# Patient Record
Sex: Male | Born: 1950 | Race: Black or African American | Hispanic: No | Marital: Single | Smoking: Current every day smoker
Health system: Southern US, Community
[De-identification: ages and names within clinical notes are randomized; demographics above are authoritative.]

---

## 2014-10-29 ENCOUNTER — Encounter (HOSPITAL_BASED_OUTPATIENT_CLINIC_OR_DEPARTMENT_OTHER): Payer: Self-pay

## 2014-10-29 ENCOUNTER — Emergency Department (HOSPITAL_BASED_OUTPATIENT_CLINIC_OR_DEPARTMENT_OTHER)
Admission: EM | Admit: 2014-10-29 | Discharge: 2014-10-29 | Disposition: A | Discharge status: Home / Self Care | Payer: Worker's Compensation | Attending: Emergency Medicine | Admitting: Emergency Medicine

## 2014-10-29 DIAGNOSIS — Z720 Tobacco use: Secondary | ICD-10-CM | POA: Diagnosis not present

## 2014-10-29 DIAGNOSIS — Y9289 Other specified places as the place of occurrence of the external cause: Secondary | ICD-10-CM | POA: Diagnosis not present

## 2014-10-29 DIAGNOSIS — Y990 Civilian activity done for income or pay: Secondary | ICD-10-CM | POA: Insufficient documentation

## 2014-10-29 DIAGNOSIS — Y288XXA Contact with other sharp object, undetermined intent, initial encounter: Secondary | ICD-10-CM | POA: Insufficient documentation

## 2014-10-29 DIAGNOSIS — Y9389 Activity, other specified: Secondary | ICD-10-CM | POA: Insufficient documentation

## 2014-10-29 DIAGNOSIS — S61412A Laceration without foreign body of left hand, initial encounter: Secondary | ICD-10-CM | POA: Diagnosis present

## 2014-10-29 DIAGNOSIS — Z23 Encounter for immunization: Secondary | ICD-10-CM | POA: Insufficient documentation

## 2014-10-29 MED ORDER — LIDOCAINE-EPINEPHRINE 1 %-1:100000 IJ SOLN
INTRAMUSCULAR | Status: AC
  Administered 2014-10-29: 1 mL
  Filled 2014-10-29: qty 1

## 2014-10-29 MED ORDER — TETANUS-DIPHTH-ACELL PERTUSSIS 5-2.5-18.5 LF-MCG/0.5 IM SUSP
0.5000 mL | Freq: Once | INTRAMUSCULAR | Status: AC
  Administered 2014-10-29: 0.5 mL via INTRAMUSCULAR
  Filled 2014-10-29: qty 0.5

## 2014-10-29 MED ORDER — LIDOCAINE-EPINEPHRINE 2 %-1:100000 IJ SOLN: 20.0000 mL | Freq: Once | INTRAMUSCULAR | Status: AC

## 2014-10-29 NOTE — ED Provider Notes (Signed)
CSN: [id number]     Arrival date & time 10/29/14  4098 History  This chart was scribed for Keith Octave, MD by Leone Payor, ED Scribe. This patient was seen in room MH08/MH08 and the patient's care was started 8:48 AM.    Chief Complaint  Patient presents with  . Extremity Laceration    The history is provided by the patient. No language interpreter was used.     HPI Comments: Keith Vincent is a 64 y.o. male who presents to the Emergency Department complaining of a laceration to the left hand that occurred 2.5 hours ago. Patient states he was using a new, clean box cutter at work when the injury occurred. He has associated minimal left hand pain. He denies numbness or tingling. Bleeding is controlled. Last tetanus is out of date.  History reviewed. No pertinent past medical history. History reviewed. No pertinent past surgical history. No family history on file. History  Substance Use Topics  . Smoking status: Current Every Day Smoker  . Smokeless tobacco: Not on file  . Alcohol Use: No    Review of Systems  A complete 10 system review of systems was obtained and all systems are negative except as noted in the HPI and PMH.    Allergies  Review of patient's allergies indicates no known allergies.  Home Medications   Prior to Admission medications   Not on File   BP 150/80 mmHg  Pulse 91  Temp(Src) 98.4 F (36.9 C) (Oral)  Resp 20  Ht  (1.727 m)  Wt 120 lb (54.432 kg)  BMI 18.25 kg/m2  SpO2 100% Physical Exam  Constitutional: He is oriented to person, place, and time. He appears well-developed and well-nourished. No distress.  HENT:  Head: Normocephalic and atraumatic.  Mouth/Throat: Oropharynx is clear and moist. No oropharyngeal exudate.  Eyes: Conjunctivae and EOM are normal. Pupils are equal, round, and reactive to light.  Neck: Normal range of motion. Neck supple.  No meningismus.  Cardiovascular: Normal rate, regular rhythm, normal heart sounds and  intact distal pulses.   No murmur heard. Pulmonary/Chest: Effort normal and breath sounds normal. No respiratory distress.  Abdominal: Soft. There is no tenderness. There is no rebound and no guarding.  Musculoskeletal: Normal range of motion. He exhibits no edema or tenderness.  Neurological: He is alert and oriented to person, place, and time. No cranial nerve deficit. He exhibits normal muscle tone. Coordination normal.  No ataxia on finger to nose bilaterally. No pronator drift. 5/5 strength throughout. CN 2-12 intact. Negative Romberg. Equal grip strength. Sensation intact. Gait is normal.   Skin: Skin is warm.  3 cm laceration to the hypothenar eminence into fat of left hand. Intact strength and sensation to all fingers.   Psychiatric: He has a normal mood and affect. His behavior is normal.  Nursing note and vitals reviewed.   ED Course  Procedures (including critical care time)  DIAGNOSTIC STUDIES: Oxygen Saturation is 100% on RA, normal by my interpretation.    COORDINATION OF CARE: 8:52 AM Discussed treatment plan with pt at bedside and pt agreed to plan.  9:07 AM LACERATION REPAIR Performed by: Keith Octave, MD  Consent: Verbal consent obtained. Risks and benefits: risks, benefits and alternatives were discussed Patient identity confirmed: provided demographic data Time out performed prior to procedure Prepped and Draped in normal sterile fashion Wound explored Laceration Location: left hand hypothenar eminence  Laceration Length: 3 cm No Foreign Bodies seen or palpated Anesthesia: local infiltration  Local anesthetic: lidocaine 1% with epinephrine Anesthetic total: 4 cc Irrigation method: syringe Amount of cleaning: standard Skin closure: 4-0 Nylon  Number of sutures: 5 Technique: simple interrupted  Patient tolerance: Patient tolerated the procedure well with no immediate complications.    Labs Review Labs Reviewed - No data to display  Imaging Review No  results found.   EKG Interpretation None      MDM   Final diagnoses:  Hand laceration, left, initial encounter   Laceration to left hand from box cutter. Neurovascularly intact. Tetanus unknown.  Wound repaired as above. Tetanus updated.  Follow-up with PCP in one week for suture removal. Return precautions discussed   Medical screening examination/treatment/procedure(s) were performed by non-physician practitioner and as supervising physician I was immediately available for consultation/collaboration.   EKG Interpretation None        Keith Octave, MD 10/29/14 [phone]

## 2014-10-29 NOTE — Discharge Instructions (Signed)

## 2014-10-29 NOTE — ED Notes (Signed)
3 cm laceration to left hand this morning while at work. Bleeding controlled, tetanus unknown

## 2014-10-29 NOTE — ED Notes (Signed)
MD at bedside. 

## 2019-08-16 ENCOUNTER — Inpatient Hospital Stay: Payer: Medicare Other

## 2019-08-16 ENCOUNTER — Inpatient Hospital Stay
Admission: EM | Admit: 2019-08-16 | Discharge: 2019-08-27 | DRG: 335 | Disposition: E | Payer: Medicare Other | Attending: Surgery | Admitting: Surgery

## 2019-08-16 ENCOUNTER — Emergency Department: Payer: Medicare Other

## 2019-08-16 ENCOUNTER — Other Ambulatory Visit: Payer: Self-pay

## 2019-08-16 DIAGNOSIS — Z66 Do not resuscitate: Secondary | ICD-10-CM | POA: Diagnosis not present

## 2019-08-16 DIAGNOSIS — I959 Hypotension, unspecified: Secondary | ICD-10-CM | POA: Diagnosis not present

## 2019-08-16 DIAGNOSIS — F17210 Nicotine dependence, cigarettes, uncomplicated: Secondary | ICD-10-CM | POA: Diagnosis present

## 2019-08-16 DIAGNOSIS — J9601 Acute respiratory failure with hypoxia: Secondary | ICD-10-CM | POA: Diagnosis not present

## 2019-08-16 DIAGNOSIS — A419 Sepsis, unspecified organism: Secondary | ICD-10-CM | POA: Diagnosis not present

## 2019-08-16 DIAGNOSIS — F10231 Alcohol dependence with withdrawal delirium: Secondary | ICD-10-CM | POA: Diagnosis not present

## 2019-08-16 DIAGNOSIS — I10 Essential (primary) hypertension: Secondary | ICD-10-CM | POA: Diagnosis present

## 2019-08-16 DIAGNOSIS — Z20828 Contact with and (suspected) exposure to other viral communicable diseases: Secondary | ICD-10-CM | POA: Diagnosis present

## 2019-08-16 DIAGNOSIS — Z515 Encounter for palliative care: Secondary | ICD-10-CM | POA: Diagnosis present

## 2019-08-16 DIAGNOSIS — R64 Cachexia: Secondary | ICD-10-CM | POA: Diagnosis present

## 2019-08-16 DIAGNOSIS — Z0189 Encounter for other specified special examinations: Secondary | ICD-10-CM

## 2019-08-16 DIAGNOSIS — K567 Ileus, unspecified: Secondary | ICD-10-CM | POA: Diagnosis not present

## 2019-08-16 DIAGNOSIS — Z9911 Dependence on respirator [ventilator] status: Secondary | ICD-10-CM

## 2019-08-16 DIAGNOSIS — R0902 Hypoxemia: Secondary | ICD-10-CM

## 2019-08-16 DIAGNOSIS — E873 Alkalosis: Secondary | ICD-10-CM | POA: Diagnosis present

## 2019-08-16 DIAGNOSIS — N170 Acute kidney failure with tubular necrosis: Secondary | ICD-10-CM | POA: Diagnosis not present

## 2019-08-16 DIAGNOSIS — E860 Dehydration: Secondary | ICD-10-CM | POA: Diagnosis present

## 2019-08-16 DIAGNOSIS — K219 Gastro-esophageal reflux disease without esophagitis: Secondary | ICD-10-CM | POA: Diagnosis present

## 2019-08-16 DIAGNOSIS — D649 Anemia, unspecified: Secondary | ICD-10-CM | POA: Diagnosis present

## 2019-08-16 DIAGNOSIS — Z681 Body mass index (BMI) 19 or less, adult: Secondary | ICD-10-CM

## 2019-08-16 DIAGNOSIS — L910 Hypertrophic scar: Secondary | ICD-10-CM | POA: Diagnosis present

## 2019-08-16 DIAGNOSIS — K56609 Unspecified intestinal obstruction, unspecified as to partial versus complete obstruction: Secondary | ICD-10-CM | POA: Diagnosis not present

## 2019-08-16 DIAGNOSIS — J9602 Acute respiratory failure with hypercapnia: Secondary | ICD-10-CM | POA: Diagnosis not present

## 2019-08-16 DIAGNOSIS — E870 Hyperosmolality and hypernatremia: Secondary | ICD-10-CM | POA: Diagnosis not present

## 2019-08-16 DIAGNOSIS — E861 Hypovolemia: Secondary | ICD-10-CM | POA: Diagnosis present

## 2019-08-16 DIAGNOSIS — J690 Pneumonitis due to inhalation of food and vomit: Secondary | ICD-10-CM | POA: Diagnosis not present

## 2019-08-16 DIAGNOSIS — N179 Acute kidney failure, unspecified: Secondary | ICD-10-CM

## 2019-08-16 DIAGNOSIS — R739 Hyperglycemia, unspecified: Secondary | ICD-10-CM | POA: Diagnosis present

## 2019-08-16 DIAGNOSIS — K565 Intestinal adhesions [bands], unspecified as to partial versus complete obstruction: Secondary | ICD-10-CM | POA: Diagnosis present

## 2019-08-16 DIAGNOSIS — R34 Anuria and oliguria: Secondary | ICD-10-CM | POA: Diagnosis present

## 2019-08-16 DIAGNOSIS — E876 Hypokalemia: Secondary | ICD-10-CM | POA: Diagnosis present

## 2019-08-16 DIAGNOSIS — Z01818 Encounter for other preprocedural examination: Secondary | ICD-10-CM

## 2019-08-16 DIAGNOSIS — R112 Nausea with vomiting, unspecified: Secondary | ICD-10-CM | POA: Diagnosis present

## 2019-08-16 DIAGNOSIS — Z8711 Personal history of peptic ulcer disease: Secondary | ICD-10-CM

## 2019-08-16 LAB — CBC
HCT: 48.2 % (ref 39.0–52.0)
Hemoglobin: 16.8 g/dL (ref 13.0–17.0)
MCH: 33.5 pg (ref 26.0–34.0)
MCHC: 34.9 g/dL (ref 30.0–36.0)
MCV: 96.2 fL (ref 80.0–100.0)
Platelets: 252 10*3/uL (ref 150–400)
RBC: 5.01 MIL/uL (ref 4.22–5.81)
RDW: 11.6 % (ref 11.5–15.5)
WBC: 5.8 10*3/uL (ref 4.0–10.5)
nRBC: 0 % (ref 0.0–0.2)

## 2019-08-16 LAB — COMPREHENSIVE METABOLIC PANEL
ALT: 11 U/L (ref 0–44)
AST: 19 U/L (ref 15–41)
Albumin: 4.4 g/dL (ref 3.5–5.0)
Alkaline Phosphatase: 46 U/L (ref 38–126)
Anion gap: 18 — ABNORMAL HIGH (ref 5–15)
BUN: 44 mg/dL — ABNORMAL HIGH (ref 8–23)
CO2: 30 mmol/L (ref 22–32)
Calcium: 8.8 mg/dL — ABNORMAL LOW (ref 8.9–10.3)
Chloride: 88 mmol/L — ABNORMAL LOW (ref 98–111)
Creatinine, Ser: 2.84 mg/dL — ABNORMAL HIGH (ref 0.61–1.24)
GFR calc Af Amer: 26 mL/min — ABNORMAL LOW (ref >60)
GFR calc non Af Amer: 22 mL/min — ABNORMAL LOW (ref >60)
Glucose, Bld: 145 mg/dL — ABNORMAL HIGH (ref 70–99)
Potassium: 3.4 mmol/L — ABNORMAL LOW (ref 3.5–5.1)
Sodium: 136 mmol/L (ref 135–145)
Total Bilirubin: 1.5 mg/dL — ABNORMAL HIGH (ref 0.3–1.2)
Total Protein: 7.8 g/dL (ref 6.5–8.1)

## 2019-08-16 LAB — LACTIC ACID, PLASMA: Lactic Acid, Venous: 1.6 mmol/L (ref 0.5–1.9)

## 2019-08-16 LAB — SARS CORONAVIRUS 2 BY RT PCR (HOSPITAL ORDER, PERFORMED IN ~~LOC~~ HOSPITAL LAB): SARS Coronavirus 2: NEGATIVE

## 2019-08-16 LAB — LIPASE, BLOOD: Lipase: 22 U/L (ref 11–51)

## 2019-08-16 MED ORDER — ONDANSETRON 4 MG PO TBDP
4.0000 mg | ORAL_TABLET | Freq: Four times a day (QID) | ORAL | Status: DC | PRN
  Filled 2019-08-16: qty 1

## 2019-08-16 MED ORDER — IOHEXOL 9 MG/ML PO SOLN
500.0000 mL | ORAL | Status: AC
  Administered 2019-08-16: 500 mL via ORAL

## 2019-08-16 MED ORDER — SODIUM CHLORIDE 0.9 % IV BOLUS
500.0000 mL | Freq: Once | INTRAVENOUS | Status: AC
  Administered 2019-08-16: 19:00:00 500 mL via INTRAVENOUS

## 2019-08-16 MED ORDER — MORPHINE SULFATE (PF) 4 MG/ML IV SOLN
4.0000 mg | Freq: Once | INTRAVENOUS | Status: AC
  Administered 2019-08-16: 19:00:00 4 mg via INTRAVENOUS
  Filled 2019-08-16: qty 1

## 2019-08-16 MED ORDER — DIATRIZOATE MEGLUMINE & SODIUM 66-10 % PO SOLN
90.0000 mL | Freq: Once | ORAL | Status: AC
  Administered 2019-08-17: 90 mL via NASOGASTRIC

## 2019-08-16 MED ORDER — MORPHINE SULFATE (PF) 2 MG/ML IV SOLN
1.0000 mg | INTRAVENOUS | Status: DC | PRN
  Administered 2019-08-18 (×2): 1 mg via INTRAVENOUS
  Filled 2019-08-16 (×2): qty 1

## 2019-08-16 MED ORDER — LACTATED RINGERS IV SOLN
INTRAVENOUS | Status: DC
  Administered 2019-08-16 – 2019-08-20 (×9): via INTRAVENOUS

## 2019-08-16 MED ORDER — ONDANSETRON HCL 4 MG/2ML IJ SOLN
4.0000 mg | Freq: Once | INTRAMUSCULAR | Status: AC
  Administered 2019-08-16: 4 mg via INTRAVENOUS
  Filled 2019-08-16: qty 2

## 2019-08-16 MED ORDER — ONDANSETRON HCL 4 MG/2ML IJ SOLN
4.0000 mg | Freq: Four times a day (QID) | INTRAMUSCULAR | Status: DC | PRN
  Administered 2019-08-17: 4 mg via INTRAVENOUS
  Filled 2019-08-16: qty 2

## 2019-08-16 MED ORDER — MORPHINE SULFATE (PF) 4 MG/ML IV SOLN
4.0000 mg | Freq: Once | INTRAVENOUS | Status: AC
  Administered 2019-08-16: 16:00:00 4 mg via INTRAVENOUS
  Filled 2019-08-16: qty 1

## 2019-08-16 MED ORDER — SODIUM CHLORIDE 0.9 % IV BOLUS
1000.0000 mL | Freq: Once | INTRAVENOUS | Status: AC
  Administered 2019-08-16: 1000 mL via INTRAVENOUS

## 2019-08-16 NOTE — ED Provider Notes (Signed)
[facility] Emergency Department Provider Note   ____________________________________________   First MD Initiated Contact with Patient 08/13/2019 1550     (approximate)  I have reviewed the triage vital signs and the nursing notes.   HISTORY  Chief Complaint Nausea    HPI Keith Vincent is a 68 y.o. male reports his only past medical history is previous alcohol abuse and a gastric surgery after an ulcer few years ago at [facility]  Patient reports that since Friday he started having significant abdominal pain.  There is fairly everywhere, quite severe but the pain subsided Monday.  However he has not been able to eat or drink anything for about 3 days.  He is not having pain now but continues to feel nausea.  Whenever he eats or drinks anything it will come back up after a bit of time.  Denies passing gas, denies bowel movement since at least Friday may be longer.     History reviewed. No pertinent past medical history.  Patient Active Problem List   Diagnosis Date Noted  . SBO (small bowel obstruction) (HCC) 08/22/2019    History reviewed. No pertinent surgical history.  Prior to Admission medications   Not on File    Allergies Patient has no known allergies.  History reviewed. No pertinent family history.  Social History Social History   Tobacco Use  . Smoking status: Current Every Day Smoker  Substance Use Topics  . Alcohol use: No  . Drug use: Not on file   Prior alcohol abuse, reports he is cut back quite a bit over the last 3 years and no longer drinks more than 1 beer daily  Review of Systems Constitutional: No fever/chills Eyes: No visual changes. ENT: No sore throat. Cardiovascular: Denies chest pain. Respiratory: Denies shortness of breath. Gastrointestinal: See HPI Genitourinary: Negative for dysuria. Musculoskeletal: Negative for back pain. Skin: Negative for rash. Neurological: Negative for  headaches, areas of focal weakness or numbness.  Denies any exposure to Covid  ____________________________________________   PHYSICAL EXAM:  VITAL SIGNS: ED Triage Vitals  Enc Vitals Group     BP --      Pulse --      Resp --      Temp --      Temp src --      SpO2 --      Weight 08/24/2019 1546 130 lb (59 kg)     Height 08/07/2019 1546 5\' 9"  (1.753 m)     Head Circumference --      Peak Flow --      Pain Score 08/17/2019 1545 0     Pain Loc --      Pain Edu? --      Excl. in GC? --     Constitutional: Alert and oriented. Well appearing and in no acute distress. Eyes: Conjunctivae are normal. Head: Atraumatic. Nose: No congestion/rhinnorhea. Mouth/Throat: Mucous membranes are dry. Neck: No stridor.  Cardiovascular: Normal rate, regular rhythm. Grossly normal heart sounds.  Good peripheral circulation. Respiratory: Normal respiratory effort.  No retractions. Lungs CTAB. Gastrointestinal: Soft and nontender.  Slightly distended, but no fluid wave.  Denies any pain or discomfort in any quadrant.  There is a soft area over the left upper quadrant, it seems just slightly protuberant but very minor in appearance which could represent an abdominal wall hernia, but it is soft and nontender.  No overlying skin lesions. Musculoskeletal: No lower extremity tenderness nor edema. Neurologic:  Normal speech and language. No gross focal neurologic deficits are appreciated.  Skin:  Skin is warm, dry and intact. No rash noted. Psychiatric: Mood and affect are normal. Speech and behavior are normal.  ____________________________________________   LABS (all labs ordered are listed, but only abnormal results are displayed)  Labs Reviewed  COMPREHENSIVE METABOLIC PANEL - Abnormal; Notable for the following components:      Result Value   Potassium 3.4 (*)    Chloride 88 (*)    Glucose, Bld 145 (*)    BUN 44 (*)    Creatinine, Ser 2.84 (*)    Calcium 8.8 (*)    Total Bilirubin 1.5 (*)     GFR calc non Af Amer 22 (*)    GFR calc Af Amer 26 (*)    Anion gap 18 (*)    All other components within normal limits  SARS CORONAVIRUS 2 BY RT PCR (HOSPITAL ORDER, PERFORMED IN [facility])  CBC  LIPASE, BLOOD   ____________________________________________  EKG   ____________________________________________  RADIOLOGY  No results found.  Discussed results with radiologist, concern for small bowel obstruction with possible free air  IMPRESSION: High-grade distal small bowel obstruction. Pneumatosis within dilated distal small bowel loops and portal venous gas in liver are highly suspicious for bowel ischemia.  Probable tiny amount of free intraperitoneal air, highly suspicious for bowel perforation.  Several small low-attenuation lesions in left hepatic lobe which cannot be characterized on this noncontrast study. Recommend further attention on follow-up CT or with MRI.  Aortic Atherosclerosis (ICD10-I70.0).  Critical Value/emergent results were called by telephone at the time of interpretation on 08/11/2019 at 6:50 pm to provider Neeraj Housand , who verbally acknowledged these results. ____________________________________________   PROCEDURES  Procedure(s) performed: None  Procedures  Critical Care performed: Yes, see critical care note(s)  CRITICAL CARE Performed by: Sharyn CreamerMark Cyndra Feinberg   Total critical care time: 35 minutes  Critical care time was exclusive of separately billable procedures and treating other patients.  Critical care was necessary to treat or prevent imminent or life-threatening deterioration.  Critical care was time spent personally by me on the following activities: development of treatment plan with patient and/or surrogate as well as nursing, discussions with consultants, evaluation of patient's response to treatment, examination of patient, obtaining history from patient or surrogate, ordering and performing treatments and  interventions, ordering and review of laboratory studies, ordering and review of radiographic studies, pulse oximetry and re-evaluation of patient's condition.  Patient with concern for perforated viscus  ____________________________________________   INITIAL IMPRESSION / ASSESSMENT AND PLAN / ED COURSE  Pertinent labs & imaging results that were available during my care of the patient were reviewed by me and considered in my medical decision making (see chart for details).   Differential diagnosis includes but is not limited to, abdominal perforation, aortic dissection, cholecystitis, appendicitis, diverticulitis, colitis, esophagitis/gastritis, kidney stone, pyelonephritis, urinary tract infection, aortic aneurysm. All are considered in decision and treatment plan. Based upon the patient's presentation and risk factors, proceed with CT scan with contrast to further evaluate for etiology.  Provide fluids, antiemetics.  Denies any acute cardiac pulmonary neurologic or vascular complaints.   Elnora MorrisonRobert A Boteler was evaluated in Emergency Department on 08/17/2019 for the symptoms described in the history of present illness. He was evaluated in the context of the global COVID-19 pandemic, which necessitated consideration that the patient might be at risk for infection with the SARS-CoV-2 virus that causes COVID-19. Institutional protocols and algorithms  that pertain to the evaluation of patients at risk for COVID-19 are in a state of rapid change based on information released by regulatory bodies including the CDC and federal and state organizations. These policies and algorithms were followed during the patient's care in the ED.   Clinical Course as of Aug 16 2015  Wed Aug 16, 2019  1623 After starting contrast, patient reports his abdominal pain is returned he is again feeling quite nauseated.  Is now sitting up at the side of the bed, appears to be wincing in pain reporting his abdominal pain is  returned.  Morphine and Zofran ordered after discussion with patient   [MQ]  [facility] consult placed staff  with Dr. Launa Flight   [MQ]    Clinical Course User Index [MQ] Delman Kitten, MD     ____________________________________________   FINAL CLINICAL IMPRESSION(S) / ED DIAGNOSES  Final diagnoses:  SBO (small bowel obstruction) ([place])        Note:  This document was prepared using Dragon voice recognition software and may include unintentional dictation errors       Delman Kitten, MD 07/28/2019 2017

## 2019-08-16 NOTE — ED Notes (Signed)
I received a call from patient's cousin [place] asking for updates on pt, asked pt if it was ok to disclose information to this person and he states no. Informed pt's cousin that I cannot disclose any information regarding this patient

## 2019-08-16 NOTE — ED Triage Notes (Addendum)
Pt speaking with Dr. Jacqualine Code at this time. Pt reports abd pain since Friday, no appetite starting Saturday, stomach pain subsided on Monday but still does not have an appetite. Reports nausea and vomiting for 3 days and not being able to have a bowel movement. Pt repots hx gastric ulcer. A&Ox4, NAD. Reports no BM in "a long time". No blood in vomit.

## 2019-08-16 NOTE — Progress Notes (Signed)
Subjective:  CC: Keith Vincent is a 68 y.o. male  Hospital stay day 0,   SBO  HPI: No changes since last exam. Slightly nauseated.  NG not placed yet.  ROS:  General: Denies weight loss, weight gain, fatigue, fevers, chills, and night sweats. Heart: Denies chest pain, palpitations, racing heart, irregular heartbeat, leg pain or swelling, and decreased activity tolerance. Respiratory: Denies breathing difficulty, shortness of breath, wheezing, cough, and sputum. GI: Denies change in appetite, heartburn,  constipation, diarrhea, and blood in stool. GU: Denies difficulty urinating, pain with urinating, urgency, frequency, blood in urine.   Objective:   Temp:  [98.4 F (36.9 C)-98.6 F (37 C)] 98.6 F (37 C) (10/21 2128) Pulse Rate:  [93-111] 102 (10/21 2128) Resp:  [18] 18 (10/21 2128) BP: (142-167)/(92-106) 167/97 (10/21 2128) SpO2:  [93 %-100 %] 100 % (10/21 2128) Weight:  [59 kg] 59 kg (10/21 1546)     Height: 5\' 9"  (175.3 cm) Weight: 59 kg BMI (Calculated): 19.19   Intake/Output this shift:  No intake or output data in the 24 hours ending 08/24/2019 2157  Constitutional :  alert, cooperative, appears stated age and no distress  Respiratory:  clear to auscultation bilaterally  Cardiovascular:  regular rate and rhythm  Gastrointestinal: soft, non-tender; bowel sounds normal; no masses,  no organomegaly.  Slight distention unchanged from previous exam  Skin: Cool and moist.   Psychiatric: Normal affect, non-agitated, not confused       LABS:  CMP Latest Ref Rng & Units 07/29/2019  Glucose 70 - 99 mg/dL 145(H)  BUN 8 - 23 mg/dL 44(H)  Creatinine 0.61 - 1.24 mg/dL 2.84(H)  Sodium 135 - 145 mmol/L 136  Potassium 3.5 - 5.1 mmol/L 3.4(L)  Chloride 98 - 111 mmol/L 88(L)  CO2 22 - 32 mmol/L 30  Calcium 8.9 - 10.3 mg/dL 8.8(L)  Total Protein 6.5 - 8.1 g/dL 7.8  Total Bilirubin 0.3 - 1.2 mg/dL 1.5(H)  Alkaline Phos 38 - 126 U/L 46  AST 15 - 41 U/L 19  ALT 0 - 44 U/L 11    CBC Latest Ref Rng & Units 08/09/2019  WBC 4.0 - 10.5 K/uL 5.8  Hemoglobin 13.0 - 17.0 g/dL 16.8  Hematocrit 39.0 - 52.0 % 48.2  Platelets 150 - 400 K/uL 252    RADS: n/a Assessment:   SBO, exam remains stable from previous exam.  Will continue to monitor.  NG tube placement now, RN notified

## 2019-08-16 NOTE — ED Notes (Signed)
Pt with large amounts of green vomit in emesis bag at this time, Dr. Jacqualine Code informed

## 2019-08-16 NOTE — H&P (Signed)
Subjective:   CC: Small bowel obstruction  HPI:  Keith Vincent is a 68 y.o. male who was consulted by [facility] for issue above.  Symptoms were first noted several days ago. Pain is sharp, intense, entire abdomen.  Associated with nausea/emeis/consitpation, exacerbated by oral intake.  States he had a very small bowel movement this morning, but not the usual amount for for a week, which is very unusual for him.  Has recently not passed flatus.  Of note, this is different from what he reported to the triage nurse as well as the ED provider.  Denies any melena, hematochezia, hematemesis.  Currently he states the pain is noticeable but tolerable after medications were administered.  He denies any recent weight changes.  Never had a colonoscopy before.   Past Medical History: none specifically reported, but chart check positive for history of alcoholism, and GERD  Past Surgical History: Open gastric ulcer repair  Family History: Reviewed and not relevant to chief complaint  Social History:  reports that he has been smoking. He does not have any smokeless tobacco history on file. He reports that he does not drink alcohol. No history on file for drug.  Current Medications: None reported  Allergies:  Allergies as of 08/15/2019  . (No Known Allergies)    ROS:  General: Denies weight loss, weight gain, fatigue, fevers, chills, and night sweats. Eyes: Denies blurry vision, double vision, eye pain, itchy eyes, and tearing. Ears: Denies hearing loss, earache, and ringing in ears. Nose: Denies sinus pain, congestion, infections, runny nose, and nosebleeds. Mouth/throat: Denies hoarseness, sore throat, bleeding gums, and difficulty swallowing. Heart: Denies chest pain, palpitations, racing heart, irregular heartbeat, leg pain or swelling, and decreased activity tolerance. Respiratory: Denies breathing difficulty, shortness of breath, wheezing, cough, and sputum. GI: Denies  diarrhea, and blood in  stool. GU: Denies difficulty urinating, pain with urinating, urgency, frequency, blood in urine. Musculoskeletal: Denies joint stiffness, pain, swelling, muscle weakness. Skin: Denies rash, itching, mass, tumors, sores, and boils Neurologic: Denies headache, fainting, dizziness, seizures, numbness, and tingling. Psychiatric: Denies depression, anxiety, difficulty sleeping, and memory loss. Endocrine: Denies heat or cold intolerance, and increased thirst or urination. Blood/lymph: Denies easy bruising, easy bruising, and swollen glands     Objective:     BP (!) 142/98   Pulse (!) 105   Temp 98.4 F (36.9 C) (Oral)   Resp 18   Ht 5\' 9"  (1.753 m)   Wt 59 kg   SpO2 96%   BMI 19.20 kg/m   Constitutional :  alert, cooperative, appears stated age and no distress  Lymphatics/Throat:  no asymmetry, masses, or scars  Respiratory:  clear to auscultation bilaterally  Cardiovascular:  regular rate and rhythm  Gastrointestinal: soft, non-tender; bowel sounds normal; no masses,  no organomegaly.   Musculoskeletal: Steady movement  Skin: Cool and moist, midline surgical scars with keloid formation.   Psychiatric: Normal affect, non-agitated, not confused       LABS:  CMP Latest Ref Rng & Units Aug 23, 2019  Glucose 70 - 99 mg/dL 07/27/2019)  BUN 8 - 23 mg/dL 865(H)  Creatinine 84(O - 1.24 mg/dL 9.62)  Sodium 9.52(W - 413 mmol/L 136  Potassium 3.5 - 5.1 mmol/L 3.4(L)  Chloride 98 - 111 mmol/L 88(L)  CO2 22 - 32 mmol/L 30  Calcium 8.9 - 10.3 mg/dL 244)  Total Protein 6.5 - 8.1 g/dL 7.8  Total Bilirubin 0.3 - 1.2 mg/dL 0.1(U)  Alkaline Phos 38 - 126 U/L 46  AST  15 - 41 U/L 19  ALT 0 - 44 U/L 11   CBC Latest Ref Rng & Units 08/26/2019  WBC 4.0 - 10.5 K/uL 5.8  Hemoglobin 13.0 - 17.0 g/dL 16.8  Hematocrit 39.0 - 52.0 % 48.2  Platelets 150 - 400 K/uL 252    RADS: CLINICAL DATA:  Abdominal pain and nausea and vomiting for 5 days.  EXAM: CT ABDOMEN AND PELVIS WITHOUT  CONTRAST  TECHNIQUE: Multidetector CT imaging of the abdomen and pelvis was performed following the standard protocol without IV contrast.  COMPARISON:  None.  FINDINGS: Lower chest: No acute findings.  Hepatobiliary: Several small low-attenuation lesions are seen in the left hepatic lobe measuring up to 1.4 cm in size, but cannot be characterized on this noncontrast study. Portal venous gas is seen throughout the right and left lobes. Gallbladder is unremarkable.  Pancreas: No mass or inflammatory process visualized on this unenhanced exam.  Spleen:  Within normal limits in size.  Adrenals/Urinary tract: No evidence of urolithiasis or hydronephrosis. Unremarkable unopacified urinary bladder.  Stomach/Bowel: Markedly dilated fluid-filled small bowel loops are seen throughout the abdomen and pelvis, with transition point in the region of the right lower quadrant. Pneumatosis is seen within dilated distal small bowel loops in the right lower abdomen. This is suspicious for bowel ischemia. Colon is nondilated. A few tiny air bubbles are seen just deep to the right rectus abdominus muscle, suspicious for free intraperitoneal air. No evidence of abscess.  Vascular/Lymphatic: No pathologically enlarged lymph nodes identified. No evidence of abdominal aortic aneurysm. Aortic atherosclerosis.  Reproductive:  No mass or other significant abnormality.  Other:  None.  Musculoskeletal:  No suspicious bone lesions identified.  IMPRESSION: High-grade distal small bowel obstruction. Pneumatosis within dilated distal small bowel loops and portal venous gas in liver are highly suspicious for bowel ischemia.  Probable tiny amount of free intraperitoneal air, highly suspicious for bowel perforation.  Several small low-attenuation lesions in left hepatic lobe which cannot be characterized on this noncontrast study. Recommend further attention on follow-up CT or with  MRI.  Aortic Atherosclerosis (ICD10-I70.0).  Critical Value/emergent results were called by telephone at the time of interpretation on 08/20/2019 at 6:50 pm to provider MARK QUALE , who verbally acknowledged these results.   Electronically Signed   By: Marlaine Hind M.D.   On: 08/04/2019 18:50  Assessment:   Small bowel obstruction based on verbal report from the radiologist along with potential areas of pneumatosis, portal venous gas.  Plan:    Despite the concerning CT images that were personally reviewed by myself as well, patient remains comfortable, with a very benign abdominal exam.  He does have persistent tachycardia in the low 100s, but that could potentially be from acute dehydration, which is consistent with his elevated creatinine level and his reported history of decreased appetite and multiple episodes of emesis.  We will continue to closely monitor him with serial abdominal exams, NG tube decompression, IV fluid hydration.  With any worsening abdominal pain, he will likely require exploratory laparotomy to confirm no ischemic bowel.  At this point since his clinical exam is so benign, I do not believe immediate surgical exploration is warranted.

## 2019-08-16 NOTE — ED Notes (Signed)
Pt otf for imaging 

## 2019-08-16 NOTE — ED Notes (Signed)
Light green tube hemolyzed per lab. Sent down new light green tube

## 2019-08-16 NOTE — Progress Notes (Signed)
NG tube placed to left nostril. Pt tolerated well.

## 2019-08-16 NOTE — ED Notes (Signed)
Patient transported to CT 

## 2019-08-16 NOTE — ED Notes (Signed)
Pt c/o abd pain and nausea, Dr. Jacqualine Code informed. Per Dr. Jacqualine Code d/c oral contrast, Mendel Ryder in CT informed.

## 2019-08-16 NOTE — ED Notes (Signed)
Report given to floor RN

## 2019-08-16 NOTE — ED Notes (Signed)
Pt reports nausea and pain have subsided

## 2019-08-16 NOTE — ED Notes (Signed)
Pt given warm blankets.

## 2019-08-17 ENCOUNTER — Inpatient Hospital Stay: Payer: Medicare Other

## 2019-08-17 LAB — CBC
HCT: 45.2 % (ref 39.0–52.0)
Hemoglobin: 15.5 g/dL (ref 13.0–17.0)
MCH: 32.6 pg (ref 26.0–34.0)
MCHC: 34.3 g/dL (ref 30.0–36.0)
MCV: 95.2 fL (ref 80.0–100.0)
Platelets: 222 10*3/uL (ref 150–400)
RBC: 4.75 MIL/uL (ref 4.22–5.81)
RDW: 11.3 % — ABNORMAL LOW (ref 11.5–15.5)
WBC: 6.8 10*3/uL (ref 4.0–10.5)
nRBC: 0 % (ref 0.0–0.2)

## 2019-08-17 LAB — BASIC METABOLIC PANEL
Anion gap: 21 — ABNORMAL HIGH (ref 5–15)
BUN: 58 mg/dL — ABNORMAL HIGH (ref 8–23)
CO2: 33 mmol/L — ABNORMAL HIGH (ref 22–32)
Calcium: 8.6 mg/dL — ABNORMAL LOW (ref 8.9–10.3)
Chloride: 84 mmol/L — ABNORMAL LOW (ref 98–111)
Creatinine, Ser: 3.15 mg/dL — ABNORMAL HIGH (ref 0.61–1.24)
GFR calc Af Amer: 23 mL/min — ABNORMAL LOW (ref >60)
GFR calc non Af Amer: 20 mL/min — ABNORMAL LOW (ref >60)
Glucose, Bld: 138 mg/dL — ABNORMAL HIGH (ref 70–99)
Potassium: 3.5 mmol/L (ref 3.5–5.1)
Sodium: 138 mmol/L (ref 135–145)

## 2019-08-17 LAB — URINALYSIS, COMPLETE (UACMP) WITH MICROSCOPIC
Bacteria, UA: NONE SEEN
Bilirubin Urine: NEGATIVE
Glucose, UA: NEGATIVE mg/dL
Hgb urine dipstick: NEGATIVE
Ketones, ur: 5 mg/dL — AB
Leukocytes,Ua: NEGATIVE
Nitrite: NEGATIVE
Protein, ur: 30 mg/dL — AB
Specific Gravity, Urine: 1.017 (ref 1.005–1.030)
pH: 6 (ref 5.0–8.0)

## 2019-08-17 LAB — LACTIC ACID, PLASMA: Lactic Acid, Venous: 1.6 mmol/L (ref 0.5–1.9)

## 2019-08-17 LAB — PHOSPHORUS: Phosphorus: 6.6 mg/dL — ABNORMAL HIGH (ref 2.5–4.6)

## 2019-08-17 LAB — MAGNESIUM: Magnesium: 2 mg/dL (ref 1.7–2.4)

## 2019-08-17 LAB — HIV ANTIBODY (ROUTINE TESTING W REFLEX): HIV Screen 4th Generation wRfx: NONREACTIVE

## 2019-08-17 MED ORDER — NICOTINE 21 MG/24HR TD PT24: 21.0000 mg | MEDICATED_PATCH | Freq: Every day | TRANSDERMAL | Status: DC | PRN

## 2019-08-17 MED ORDER — LORAZEPAM 1 MG PO TABS: 1.0000 mg | ORAL_TABLET | ORAL | Status: AC | PRN

## 2019-08-17 MED ORDER — THIAMINE HCL 100 MG/ML IJ SOLN
100.0000 mg | Freq: Every day | INTRAMUSCULAR | Status: DC
  Administered 2019-08-20 – 2019-08-23 (×3): 100 mg via INTRAVENOUS
  Filled 2019-08-17 (×3): qty 2

## 2019-08-17 MED ORDER — ADULT MULTIVITAMIN W/MINERALS CH
1.0000 | ORAL_TABLET | Freq: Every day | ORAL | Status: DC
  Administered 2019-08-22: 08:00:00 1 via ORAL
  Filled 2019-08-17 (×3): qty 1

## 2019-08-17 MED ORDER — HEPARIN SODIUM (PORCINE) 5000 UNIT/ML IJ SOLN
5000.0000 [IU] | Freq: Three times a day (TID) | INTRAMUSCULAR | Status: DC
  Administered 2019-08-17 – 2019-08-23 (×15): 5000 [IU] via SUBCUTANEOUS
  Filled 2019-08-17 (×15): qty 1

## 2019-08-17 MED ORDER — LORAZEPAM 2 MG/ML IJ SOLN: 1.0000 mg | INTRAMUSCULAR | Status: AC | PRN

## 2019-08-17 MED ORDER — VITAMIN B-1 100 MG PO TABS: 100.0000 mg | ORAL_TABLET | Freq: Every day | ORAL | Status: DC

## 2019-08-17 MED ORDER — LABETALOL HCL 5 MG/ML IV SOLN: 5.0000 mg | Freq: Four times a day (QID) | INTRAVENOUS | Status: DC | PRN

## 2019-08-17 MED ORDER — FOLIC ACID 1 MG PO TABS
1.0000 mg | ORAL_TABLET | Freq: Every day | ORAL | Status: DC
  Administered 2019-08-22: 08:00:00 1 mg via ORAL
  Filled 2019-08-17 (×3): qty 1

## 2019-08-17 NOTE — Consult Note (Addendum)
[place] [facility] NAME: Keith Vincent    MR#:  [id number]  DATE OF BIRTH:  07-11-1951  DATE OF ADMISSION:  08/24/2019  PRIMARY CARE PHYSICIAN: Patient, No Pcp Per   REQUESTING/REFERRING PHYSICIAN: Benjamine Sprague, DO  CHIEF COMPLAINT:   Chief Complaint  Patient presents with   Nausea    HISTORY OF PRESENT ILLNESS:  Keith Vincent  is a 68 y.o. male with a PMH of alcoholism and GERD who was admitted yesterday evening with severe abdominal pain, nausea, and vomiting. In the ED, CT abdomen/pelvis showed a high grade distal small bowel obstruction with pneumatosis within the dilated distal small bowel loops and portal venous gas in the liver. NG tube was placed and he was started on IVFs. On admission, he was noted to have an AKI vs CKD. Creatinine worsened from 2.84 yesterday to 3.15 this morning, so hospitalists were called for consult.  Patient states his abdominal pain has been going on for a week and was getting significantly worse. His abdominal pain is improving from yesterday to today. He is still having a few episodes of vomiting this morning. He denies any dysuria, urinary frequency, urinary urgency, or weak stream. No suprapubic abdominal pain. No NSAID use at home.  PAST MEDICAL HISTORY:  Alcohol abuse GERD  PAST SURGICAL HISTOIRY:  History reviewed. No pertinent surgical history.  SOCIAL HISTORY:   Social History   Tobacco Use   Smoking status: Current Every Day Smoker  Substance Use Topics   Alcohol use: No    FAMILY HISTORY:  Father- heart disease  DRUG ALLERGIES:  No Known Allergies  REVIEW OF SYSTEMS:  CONSTITUTIONAL: No fever, fatigue or weakness.  EYES: No blurred or double vision.  EARS, NOSE, AND THROAT: No tinnitus or ear pain.  RESPIRATORY: No cough, shortness of breath, wheezing or hemoptysis.  CARDIOVASCULAR: No chest pain, orthopnea, edema.  GASTROINTESTINAL: No nausea, vomiting, diarrhea or  abdominal pain.  GENITOURINARY: No dysuria, hematuria.  ENDOCRINE: No polyuria, nocturia,  HEMATOLOGY: No anemia, easy bruising or bleeding SKIN: No rash or lesion. MUSCULOSKELETAL: No joint pain or arthritis.   NEUROLOGIC: No tingling, numbness, weakness.  PSYCHIATRY: No anxiety or depression.   MEDICATIONS AT HOME:   Prior to Admission medications   Not on File      VITAL SIGNS:  Blood pressure (!) 145/92, pulse 100, temperature 98 F (36.7 C), temperature source Oral, resp. rate 18, height 5\' 9"  (1.753 m), weight 59 kg, SpO2 96 %.  PHYSICAL EXAMINATION:  GENERAL:  68 y.o.-year-old patient lying in the bed with no acute distress.  EYES: Pupils equal, round, reactive to light and accommodation. No scleral icterus. Extraocular muscles intact.  HEENT: Head atraumatic, normocephalic. Oropharynx and nasopharynx clear.  NECK:  Supple, no jugular venous distention. No thyroid enlargement, no tenderness.  LUNGS: Normal breath sounds bilaterally, no wheezing, rales,rhonchi or crepitation. No use of accessory muscles of respiration.  CARDIOVASCULAR: S1, S2 normal. No murmurs, rubs, or gallops.  ABDOMEN: Soft, nontender, nondistended. Bowel sounds present. No organomegaly or mass.  EXTREMITIES: No pedal edema, cyanosis, or clubbing.  NEUROLOGIC: Cranial nerves II through XII are intact. Muscle strength 5/5 in all extremities. Sensation intact. Gait not checked.  PSYCHIATRIC: The patient is alert and oriented x 3.  SKIN: No obvious rash, lesion, or ulcer.  LABORATORY PANEL:   CBC Recent Labs  Lab 08/17/19 0516  WBC 6.8  HGB 15.5  HCT 45.2  PLT 222   ------------------------------------------------------------------------------------------------------------------  Chemistries  Recent Labs  Lab 2019-08-20 1710 08/17/19 0516  NA 136 138  K 3.4* 3.5  CL 88* 84*  CO2 30 33*  GLUCOSE 145* 138*  BUN 44* 58*  CREATININE 2.84* 3.15*  CALCIUM 8.8* 8.6*  MG  --  2.0  AST 19  --     ALT 11  --   ALKPHOS 46  --   BILITOT 1.5*  --    ------------------------------------------------------------------------------------------------------------------  Cardiac Enzymes No results for input(s): TROPONINI in the last 168 hours. ------------------------------------------------------------------------------------------------------------------  RADIOLOGY:  Ct Abdomen Pelvis Wo Contrast  Result Date: 2019/08/20 CLINICAL DATA:  Abdominal pain and nausea and vomiting for 5 days. EXAM: CT ABDOMEN AND PELVIS WITHOUT CONTRAST TECHNIQUE: Multidetector CT imaging of the abdomen and pelvis was performed following the standard protocol without IV contrast. COMPARISON:  None. FINDINGS: Lower chest: No acute findings. Hepatobiliary: Several small low-attenuation lesions are seen in the left hepatic lobe measuring up to 1.4 cm in size, but cannot be characterized on this noncontrast study. Portal venous gas is seen throughout the right and left lobes. Gallbladder is unremarkable. Pancreas: No mass or inflammatory process visualized on this unenhanced exam. Spleen:  Within normal limits in size. Adrenals/Urinary tract: No evidence of urolithiasis or hydronephrosis. Unremarkable unopacified urinary bladder. Stomach/Bowel: Markedly dilated fluid-filled small bowel loops are seen throughout the abdomen and pelvis, with transition point in the region of the right lower quadrant. Pneumatosis is seen within dilated distal small bowel loops in the right lower abdomen. This is suspicious for bowel ischemia. Colon is nondilated. A few tiny air bubbles are seen just deep to the right rectus abdominus muscle, suspicious for free intraperitoneal air. No evidence of abscess. Vascular/Lymphatic: No pathologically enlarged lymph nodes identified. No evidence of abdominal aortic aneurysm. Aortic atherosclerosis. Reproductive:  No mass or other significant abnormality. Other:  None. Musculoskeletal:  No suspicious bone  lesions identified. IMPRESSION: High-grade distal small bowel obstruction. Pneumatosis within dilated distal small bowel loops and portal venous gas in liver are highly suspicious for bowel ischemia. Probable tiny amount of free intraperitoneal air, highly suspicious for bowel perforation. Several small low-attenuation lesions in left hepatic lobe which cannot be characterized on this noncontrast study. Recommend further attention on follow-up CT or with MRI. Aortic Atherosclerosis (ICD10-I70.0). Critical Value/emergent results were called by telephone at the time of interpretation on 2019-08-20 at 6:50 pm to provider MARK QUALE , who verbally acknowledged these results. Electronically Signed   By: Danae Orleans M.D.   On: 2019-08-20 18:50   Dg Abd Portable 1v-small Bowel Protocol-position Verification  Result Date: 08/17/2019 CLINICAL DATA:  NG tube placement EXAM: PORTABLE ABDOMEN - 1 VIEW COMPARISON:  None. FINDINGS: Tip of the NG tube is seen projecting over the mid body of the stomach. Mildly dilated air-filled loops of small bowel in the mid abdomen. IMPRESSION: Tip of the NG tube seen projecting over the mid body of the stomach. Electronically Signed   By: Jonna Clark M.D.   On: 08/17/2019 02:50    EKG:  No orders found for this or any previous visit.  IMPRESSION AND PLAN:   AKI- likely due to dehydration in the setting of nausea, vomiting, and poor po intake. Could also be post-obstructive in etiology. Patient is not on any nephrotoxic meds and did not receive contrast. He likely does have some underlying CKD. -Continue IVFs at 100 ml/hr -Check UA and renal US -Strict I/O -Avoid nephrotoxic agents -Recheck creatinine in the morning  High grade SBO -  NG tube in place -NPO -Management per surgery  Hypertension- BP has been elevated, likely secondary to pain -Will add labetalol IV prn   Hyperglycemia- no diagnosis of diabetes -Check A1c -May need to add sliding scale  Alcohol  abuse, possibly with some alcoholic ketoacidosis- patient drinks a couple of 40oz beers per day -CIWA -Start multivitamin, folate, and B12 -Continue IVFs  Tobacco use- smokes 1 ppd -Nicotine patch prn -Tobacco cessation counseling provided  All the records are reviewed and case discussed with Consulting provider. Management plans discussed with the patient, family and they are in agreement.  CODE STATUS: Full  TOTAL TIME TAKING CARE OF THIS PATIENT: 45 minutes.    Jinny BlossomKaty D Kaleena Corrow M.D on 08/17/2019 at 7:35 AM  Between 7am to 6pm - Pager - [phone]  After 6pm go to [email or web address] - Social research officer, governmentpassword EPAS [email or web address]  [facility]  Office  [phone]  CC: Primary care Physician: Patient, No Pcp Per   Note: This dictation was prepared with Dragon dictation along with smaller phrase technology. Any transcriptional errors that result from this process are unintentional.

## 2019-08-17 NOTE — Progress Notes (Signed)
Subjective:  CC: Keith Vincent is a 68 y.o. male  Hospital stay day 1,   SBO  HPI: No changes since last exam. Slightly nauseated.  Had another episode of emesis after NG tube placement.  ROS:  General: Denies weight loss, weight gain, fatigue, fevers, chills, and night sweats. Heart: Denies chest pain, palpitations, racing heart, irregular heartbeat, leg pain or swelling, and decreased activity tolerance. Respiratory: Denies breathing difficulty, shortness of breath, wheezing, cough, and sputum. GI: Denies change in appetite, heartburn,  constipation, diarrhea, and blood in stool. GU: Denies difficulty urinating, pain with urinating, urgency, frequency, blood in urine.   Objective:   Temp:  [98 F (36.7 C)-98.6 F (37 C)] 98 F (36.7 C) (10/22 0510) Pulse Rate:  [93-111] 100 (10/22 0510) Resp:  [18] 18 (10/22 0510) BP: (142-167)/(92-106) 145/92 (10/22 0510) SpO2:  [93 %-100 %] 96 % (10/22 0510) Weight:  [59 kg] 59 kg (10/21 1546)     Height: 5\' 9"  (175.3 cm) Weight: 59 kg BMI (Calculated): 19.19   Intake/Output this shift:   Intake/Output Summary (Last 24 hours) at 08/17/2019 1156 Last data filed at 08/17/2019 1014 Gross per 24 hour  Intake -  Output 1500 ml  Net -1500 ml    Constitutional :  alert, cooperative, appears stated age and no distress  Respiratory:  clear to auscultation bilaterally  Cardiovascular:  regular rate and rhythm  Gastrointestinal: soft, non-tender; bowel sounds normal; no masses,  no organomegaly.  Slight distention unchanged from previous exam  Skin: Cool and moist.   Psychiatric: Normal affect, non-agitated, not confused       LABS:  CMP Latest Ref Rng & Units 08/17/2019 09/06/19  Glucose 70 - 99 mg/dL 138(H) 145(H)  BUN 8 - 23 mg/dL 58(H) 44(H)  Creatinine 0.61 - 1.24 mg/dL 3.15(H) 2.84(H)  Sodium 135 - 145 mmol/L 138 136  Potassium 3.5 - 5.1 mmol/L 3.5 3.4(L)  Chloride 98 - 111 mmol/L 84(L) 88(L)  CO2 22 - 32 mmol/L 33(H) 30   Calcium 8.9 - 10.3 mg/dL 8.6(L) 8.8(L)  Total Protein 6.5 - 8.1 g/dL - 7.8  Total Bilirubin 0.3 - 1.2 mg/dL - 1.5(H)  Alkaline Phos 38 - 126 U/L - 46  AST 15 - 41 U/L - 19  ALT 0 - 44 U/L - 11   CBC Latest Ref Rng & Units 08/17/2019 09-06-19  WBC 4.0 - 10.5 K/uL 6.8 5.8  Hemoglobin 13.0 - 17.0 g/dL 15.5 16.8  Hematocrit 39.0 - 52.0 % 45.2 48.2  Platelets 150 - 400 K/uL 222 252    RADS: n/a Assessment:   SBO, exam remains stable from previous exam.  Adjustment of NG tube performed by myself and noted to have an extra 750 mL's of output afterwards.  Emesis episode likely due to a slightly malfunctioning NG tube.  We will continue to monitor at this time since patient has not worsened in physical exam.  He stated he has during the small bowel follow-through contrast.  We will follow-up on that as well.  Appreciate hospitalist consult.  Continue per their recommendations regarding acute kidney injury, hypertension, hyperglycemia, and alcohol and tobacco abuse,

## 2019-08-17 NOTE — Progress Notes (Signed)
Initial Nutrition Assessment  DOCUMENTATION CODES:   Not applicable  INTERVENTION:   RD will monitor for diet advancement vs the need for nutrition support  Pt is at high refeed risk   NUTRITION DIAGNOSIS:   Inadequate oral intake related to acute illness(SBO) as evidenced by NPO status.  GOAL:   Patient will meet greater than or equal to 90% of their needs  MONITOR:   Labs, Weight trends, Skin, I & O's, Diet advancement  REASON FOR ASSESSMENT:   Malnutrition Screening Tool    ASSESSMENT:   68 y.o. male with a PMH of alcoholism, GERD, PUD s/p ex lap and graham patch 2017 for perforated ulcer who presented with severe abdominal pain, nausea, and vomiting. CT abdomen/pelvis showed a high grade distal small bowel obstruction with pneumatosis within the dilated distal small bowel loops and portal venous gas in the liver.   Pt with poor appetite and oral intake for 1 week pta r/t abdominal pain, nausea and vomiting. RD suspects pt with poor appetite and oral intake at baseline r/t etoh abuse. Pt is currently NPO. NGT in place with 1537ml output. Pt reports continued nausea today. Per chart, pt appears fairly weight stable pta; however, there is not a very detailed weight history in chart. Pt is at high risk for malnutrition but unable to diagnose at this time as NFPE cannot be performed.    Medications reviewed and include: folic acid, heparin, MVI, thiamine, LRS @100ml /hr  Labs reviewed: K 3.5 wnl, BUN 58(H), creat 3.15(H), P 6.6(H), Mg 2.0 wnl cbgs- 145, 138 x 24 hrs  Unable to complete Nutrition-Focused physical exam at this time.   Diet Order:   Diet Order            Diet NPO time specified  Diet effective now             EDUCATION NEEDS:   No education needs have been identified at this time  Skin:  Skin Assessment: Reviewed RN Assessment  Last BM:  pta  Height:   Ht Readings from Last 1 Encounters:  08/09/2019 5\' 9"  (1.753 m)    Weight:   Wt Readings  from Last 1 Encounters:  08/14/2019 59 kg    Ideal Body Weight:  72.7 kg  BMI:  Body mass index is 19.2 kg/m.  Estimated Nutritional Needs:   Kcal:  1800-2100kcal/day  Protein:  90-105g/day  Fluid:  >1.5L/day  Koleen Distance MS, RD, LDN Pager #- [phone] Office#- [phone] After Hours Pager: [phone]

## 2019-08-18 ENCOUNTER — Inpatient Hospital Stay: Payer: Medicare Other | Admitting: Anesthesiology

## 2019-08-18 ENCOUNTER — Encounter: Payer: Self-pay | Admitting: *Deleted

## 2019-08-18 ENCOUNTER — Encounter: Admission: EM | Disposition: E | Payer: Self-pay | Source: Home / Self Care | Attending: Surgery

## 2019-08-18 HISTORY — PX: LYSIS OF ADHESION: SHX5961

## 2019-08-18 HISTORY — PX: LAPAROTOMY: SHX154

## 2019-08-18 LAB — BASIC METABOLIC PANEL
Anion gap: 24 — ABNORMAL HIGH (ref 5–15)
BUN: 78 mg/dL — ABNORMAL HIGH (ref 8–23)
CO2: 36 mmol/L — ABNORMAL HIGH (ref 22–32)
Calcium: 8.4 mg/dL — ABNORMAL LOW (ref 8.9–10.3)
Chloride: 81 mmol/L — ABNORMAL LOW (ref 98–111)
Creatinine, Ser: 4.44 mg/dL — ABNORMAL HIGH (ref 0.61–1.24)
GFR calc Af Amer: 15 mL/min — ABNORMAL LOW (ref >60)
GFR calc non Af Amer: 13 mL/min — ABNORMAL LOW (ref >60)
Glucose, Bld: 100 mg/dL — ABNORMAL HIGH (ref 70–99)
Potassium: 3.3 mmol/L — ABNORMAL LOW (ref 3.5–5.1)
Sodium: 141 mmol/L (ref 135–145)

## 2019-08-18 LAB — CBC
HCT: 45 % (ref 39.0–52.0)
Hemoglobin: 15.1 g/dL (ref 13.0–17.0)
MCH: 33 pg (ref 26.0–34.0)
MCHC: 33.6 g/dL (ref 30.0–36.0)
MCV: 98.3 fL (ref 80.0–100.0)
Platelets: 225 10*3/uL (ref 150–400)
RBC: 4.58 MIL/uL (ref 4.22–5.81)
RDW: 11.7 % (ref 11.5–15.5)
WBC: 6.1 10*3/uL (ref 4.0–10.5)
nRBC: 0 % (ref 0.0–0.2)

## 2019-08-18 LAB — HEMOGLOBIN A1C
Hgb A1c MFr Bld: 4.5 % — ABNORMAL LOW (ref 4.8–5.6)
Mean Plasma Glucose: 82.45 mg/dL

## 2019-08-18 LAB — PHOSPHORUS: Phosphorus: 6.9 mg/dL — ABNORMAL HIGH (ref 2.5–4.6)

## 2019-08-18 LAB — MAGNESIUM: Magnesium: 2.3 mg/dL (ref 1.7–2.4)

## 2019-08-18 SURGERY — LAPAROTOMY, EXPLORATORY
Anesthesia: General | Site: Abdomen

## 2019-08-18 MED ORDER — SUCCINYLCHOLINE CHLORIDE 20 MG/ML IJ SOLN
INTRAMUSCULAR | Status: AC
  Filled 2019-08-18: qty 1

## 2019-08-18 MED ORDER — FENTANYL CITRATE (PF) 100 MCG/2ML IJ SOLN
INTRAMUSCULAR | Status: AC
  Filled 2019-08-18: qty 2

## 2019-08-18 MED ORDER — HYDROMORPHONE HCL 1 MG/ML IJ SOLN
0.5000 mg | INTRAMUSCULAR | Status: DC | PRN
  Administered 2019-08-18 – 2019-08-21 (×12): 0.5 mg via INTRAVENOUS
  Filled 2019-08-18 (×2): qty 0.5
  Filled 2019-08-18: qty 1
  Filled 2019-08-18: qty 0.5
  Filled 2019-08-18 (×3): qty 1
  Filled 2019-08-18 (×4): qty 0.5
  Filled 2019-08-18: qty 1

## 2019-08-18 MED ORDER — BUPIVACAINE-EPINEPHRINE 0.5% -1:200000 IJ SOLN
INTRAMUSCULAR | Status: DC | PRN
  Administered 2019-08-18: 30 mL

## 2019-08-18 MED ORDER — FENTANYL CITRATE (PF) 100 MCG/2ML IJ SOLN
INTRAMUSCULAR | Status: AC
  Administered 2019-08-18: 17:00:00 25 ug via INTRAVENOUS
  Filled 2019-08-18: qty 2

## 2019-08-18 MED ORDER — LACTATED RINGERS IV SOLN
INTRAVENOUS | Status: DC | PRN
  Administered 2019-08-18: 16:00:00 via INTRAVENOUS

## 2019-08-18 MED ORDER — PROPOFOL 10 MG/ML IV BOLUS
INTRAVENOUS | Status: AC
  Filled 2019-08-18: qty 20

## 2019-08-18 MED ORDER — FENTANYL CITRATE (PF) 100 MCG/2ML IJ SOLN
25.0000 ug | INTRAMUSCULAR | Status: DC | PRN
  Administered 2019-08-18 (×4): 25 ug via INTRAVENOUS

## 2019-08-18 MED ORDER — PHENYLEPHRINE HCL (PRESSORS) 10 MG/ML IV SOLN
INTRAVENOUS | Status: DC | PRN
  Administered 2019-08-18 (×2): 100 ug via INTRAVENOUS
  Administered 2019-08-18: 150 ug via INTRAVENOUS

## 2019-08-18 MED ORDER — MIDAZOLAM HCL 2 MG/2ML IJ SOLN
INTRAMUSCULAR | Status: AC
  Filled 2019-08-18: qty 2

## 2019-08-18 MED ORDER — FENTANYL CITRATE (PF) 100 MCG/2ML IJ SOLN
INTRAMUSCULAR | Status: DC | PRN
  Administered 2019-08-18: 50 ug via INTRAVENOUS
  Administered 2019-08-18: 100 ug via INTRAVENOUS
  Administered 2019-08-18: 50 ug via INTRAVENOUS

## 2019-08-18 MED ORDER — BUPIVACAINE LIPOSOME 1.3 % IJ SUSP
INTRAMUSCULAR | Status: DC | PRN
  Administered 2019-08-18: 20 mL

## 2019-08-18 MED ORDER — PROPOFOL 10 MG/ML IV BOLUS
INTRAVENOUS | Status: DC | PRN
  Administered 2019-08-18: 80 mg via INTRAVENOUS

## 2019-08-18 MED ORDER — BUPIVACAINE LIPOSOME 1.3 % IJ SUSP
INTRAMUSCULAR | Status: AC
  Filled 2019-08-18: qty 20

## 2019-08-18 MED ORDER — BUPIVACAINE HCL (PF) 0.5 % IJ SOLN
INTRAMUSCULAR | Status: AC
  Filled 2019-08-18: qty 30

## 2019-08-18 MED ORDER — EPINEPHRINE PF 1 MG/ML IJ SOLN
INTRAMUSCULAR | Status: AC
  Filled 2019-08-18: qty 1

## 2019-08-18 MED ORDER — ROCURONIUM BROMIDE 100 MG/10ML IV SOLN
INTRAVENOUS | Status: DC | PRN
  Administered 2019-08-18: 30 mg via INTRAVENOUS
  Administered 2019-08-18: 20 mg via INTRAVENOUS
  Administered 2019-08-18: 10 mg via INTRAVENOUS

## 2019-08-18 MED ORDER — SUCCINYLCHOLINE CHLORIDE 20 MG/ML IJ SOLN
INTRAMUSCULAR | Status: DC | PRN
  Administered 2019-08-18: 80 mg via INTRAVENOUS

## 2019-08-18 MED ORDER — SODIUM CHLORIDE 0.9 % IV SOLN
INTRAVENOUS | Status: DC | PRN
  Administered 2019-08-18: 14:00:00 via INTRAVENOUS

## 2019-08-18 MED ORDER — SODIUM CHLORIDE (PF) 0.9 % IJ SOLN
INTRAMUSCULAR | Status: AC
  Filled 2019-08-18: qty 10

## 2019-08-18 MED ORDER — ONDANSETRON HCL 4 MG/2ML IJ SOLN: 4.0000 mg | Freq: Once | INTRAMUSCULAR | Status: DC | PRN

## 2019-08-18 MED ORDER — SODIUM CHLORIDE (PF) 0.9 % IJ SOLN
INTRAMUSCULAR | Status: AC
  Filled 2019-08-18: qty 50

## 2019-08-18 MED ORDER — ONDANSETRON HCL 4 MG/2ML IJ SOLN
INTRAMUSCULAR | Status: DC | PRN
  Administered 2019-08-18: 4 mg via INTRAVENOUS

## 2019-08-18 MED ORDER — LABETALOL HCL 5 MG/ML IV SOLN
INTRAVENOUS | Status: DC | PRN
  Administered 2019-08-18: 5 mg via INTRAVENOUS

## 2019-08-18 MED ORDER — POTASSIUM CHLORIDE 10 MEQ/100ML IV SOLN
10.0000 meq | INTRAVENOUS | Status: AC
  Administered 2019-08-18 (×2): 10 meq via INTRAVENOUS
  Filled 2019-08-18 (×2): qty 100

## 2019-08-18 MED ORDER — SODIUM CHLORIDE (PF) 0.9 % IJ SOLN
INTRAMUSCULAR | Status: DC | PRN
  Administered 2019-08-18: 50 mL

## 2019-08-18 MED ORDER — SUGAMMADEX SODIUM 200 MG/2ML IV SOLN
INTRAVENOUS | Status: DC | PRN
  Administered 2019-08-18: 120 mg via INTRAVENOUS

## 2019-08-18 MED ORDER — LACTATED RINGERS IV SOLN
INTRAVENOUS | Status: DC | PRN
  Administered 2019-08-18: 17:00:00 via INTRAVENOUS

## 2019-08-18 MED ORDER — LIDOCAINE HCL (PF) 2 % IJ SOLN
INTRAMUSCULAR | Status: AC
  Filled 2019-08-18: qty 10

## 2019-08-18 MED ORDER — LIDOCAINE HCL (CARDIAC) PF 100 MG/5ML IV SOSY
PREFILLED_SYRINGE | INTRAVENOUS | Status: DC | PRN
  Administered 2019-08-18: 100 mg via INTRAVENOUS

## 2019-08-18 MED ORDER — CEFAZOLIN SODIUM-DEXTROSE 2-3 GM-%(50ML) IV SOLR
INTRAVENOUS | Status: DC | PRN
  Administered 2019-08-18: 2 g via INTRAVENOUS

## 2019-08-18 SURGICAL SUPPLY — 33 items
CANISTER SUCT 1200ML W/VALVE (MISCELLANEOUS) ×3 IMPLANT
CHLORAPREP W/TINT 26 (MISCELLANEOUS) ×3 IMPLANT
COVER WAND RF STERILE (DRAPES) ×3 IMPLANT
DRAPE LAPAROTOMY 100X77 ABD (DRAPES) ×3 IMPLANT
DRSG OPSITE POSTOP 4X10 (GAUZE/BANDAGES/DRESSINGS) ×3 IMPLANT
DRSG OPSITE POSTOP 4X8 (GAUZE/BANDAGES/DRESSINGS) ×1 IMPLANT
DRSG TEGADERM 4X10 (GAUZE/BANDAGES/DRESSINGS) ×1 IMPLANT
DRSG TELFA 3X8 NADH (GAUZE/BANDAGES/DRESSINGS) ×3 IMPLANT
ELECT REM PT RETURN 9FT ADLT (ELECTROSURGICAL) ×3
ELECTRODE REM PT RTRN 9FT ADLT (ELECTROSURGICAL) ×1 IMPLANT
GLOVE BIOGEL PI IND STRL 7.0 (GLOVE) ×1 IMPLANT
GLOVE BIOGEL PI INDICATOR 7.0 (GLOVE) ×2
GLOVE SURG SYN 6.5 ES PF (GLOVE) ×3 IMPLANT
GLOVE SURG SYN 6.5 PF PI (GLOVE) ×1 IMPLANT
GOWN STRL REUS W/ TWL LRG LVL3 (GOWN DISPOSABLE) ×2 IMPLANT
GOWN STRL REUS W/TWL LRG LVL3 (GOWN DISPOSABLE) ×8
KIT TURNOVER KIT A (KITS) ×3 IMPLANT
LABEL OR SOLS (LABEL) ×3 IMPLANT
NS IRRIG 1000ML POUR BTL (IV SOLUTION) ×3 IMPLANT
PACK BASIN MAJOR ARMC (MISCELLANEOUS) ×3 IMPLANT
PACK COLON CLEAN CLOSURE (MISCELLANEOUS) ×1 IMPLANT
PAD DRESSING TELFA 3X8 NADH (GAUZE/BANDAGES/DRESSINGS) ×1 IMPLANT
SPONGE LAP 18X18 RF (DISPOSABLE) ×3 IMPLANT
STAPLER SKIN PROX 35W (STAPLE) ×2 IMPLANT
SUT PDS AB 1 TP1 54 (SUTURE) ×6 IMPLANT
SUT SILK 2 0 (SUTURE) ×2
SUT SILK 2-0 18XBRD TIE 12 (SUTURE) ×1 IMPLANT
SUT SILK 3 0 (SUTURE) ×2
SUT SILK 3-0 (SUTURE) ×2 IMPLANT
SUT SILK 3-0 18XBRD TIE 12 (SUTURE) ×1 IMPLANT
SUT VIC AB 3-0 SH 27 (SUTURE) ×4
SUT VIC AB 3-0 SH 27X BRD (SUTURE) ×2 IMPLANT
TRAY FOLEY MTR SLVR 16FR STAT (SET/KITS/TRAYS/PACK) ×3 IMPLANT

## 2019-08-18 NOTE — Progress Notes (Signed)
Lungs clear but has poor effort

## 2019-08-18 NOTE — Anesthesia Postprocedure Evaluation (Signed)
Anesthesia Post Note  Patient: Keith Vincent  Procedure(s) Performed: EXPLORATORY LAPAROTOMY (N/A Abdomen) LYSIS OF ADHESION (N/A Abdomen)  Patient location during evaluation: PACU Anesthesia Type: General Level of consciousness: awake and alert Pain management: pain level controlled Vital Signs Assessment: post-procedure vital signs reviewed and stable Respiratory status: spontaneous breathing, nonlabored ventilation, respiratory function stable and patient connected to nasal cannula oxygen Cardiovascular status: blood pressure returned to baseline and stable Postop Assessment: no apparent nausea or vomiting Anesthetic complications: no     Last Vitals:  Vitals:   08-27-2019 2123 Aug 27, 2019 2134  BP:    Pulse: (!) 106 (!) 113  Resp:    Temp:    SpO2: 90% 91%    Last Pain:  Vitals:   08-27-2019 2121  TempSrc: Oral  PainSc:                  [name]

## 2019-08-18 NOTE — Progress Notes (Signed)
[facility] [place] [facility]   PATIENT NAME: Keith Vincent    MR#:  [id number]  DATE OF BIRTH:  06/14/1951  SUBJECTIVE:   Patient is having continued severe abdominal pain today.  He states that his nausea and vomiting have resolved.  He has not been passing gas or had a bowel movement.  REVIEW OF SYSTEMS:  Review of Systems  Constitutional: Negative for chills and fever.  HENT: Negative for congestion and sore throat.   Eyes: Negative for blurred vision and double vision.  Respiratory: Negative for cough and shortness of breath.   Cardiovascular: Negative for chest pain and palpitations.  Gastrointestinal: Positive for abdominal pain. Negative for diarrhea, nausea and vomiting.  Genitourinary: Negative for dysuria and urgency.  Musculoskeletal: Negative for back pain and neck pain.  Neurological: Negative for dizziness and headaches.  Psychiatric/Behavioral: Negative for depression. The patient is not nervous/anxious.     DRUG ALLERGIES:  No Known Allergies VITALS:  Blood pressure 123/88, pulse (!) 103, temperature (!) 96.9 F (36.1 C), temperature source Tympanic, resp. rate 16, height 5\' 9"  (1.753 m), weight 59 kg, SpO2 94 %. PHYSICAL EXAMINATION:  Physical Exam  GENERAL:  Laying in the bed with no acute distress. Appears to be in pain. HEENT: Head atraumatic, normocephalic. Pupils equal, round, reactive to light and accommodation. No scleral icterus. Extraocular muscles intact. Oropharynx and nasopharynx clear.  NECK:  Supple, no jugular venous distention. No thyroid enlargement. LUNGS: Lungs are clear to auscultation bilaterally. No wheezes, crackles, rhonchi. No use of accessory muscles of respiration.  CARDIOVASCULAR: RRR, S1, S2 normal. No murmurs, rubs, or gallops.  ABDOMEN: +somewhat firm, distended, +diffuse tenderness to palpation. Bowel sounds present.  EXTREMITIES: No pedal edema, cyanosis, or clubbing.  NEUROLOGIC: CN 2-12 intact, no  focal deficits. +global weakness. Sensation intact throughout. Gait not checked.  PSYCHIATRIC: The patient is alert and oriented x 3.  SKIN: No obvious rash, lesion, or ulcer.  LABORATORY PANEL:  Male CBC Recent Labs  Lab 2019/08/25 0524  WBC 6.1  HGB 15.1  HCT 45.0  PLT 225   ------------------------------------------------------------------------------------------------------------------ Chemistries  Recent Labs  Lab 07/29/2019 1710  08-25-19 0524  NA 136   < > 141  K 3.4*   < > 3.3*  CL 88*   < > 81*  CO2 30   < > 36*  GLUCOSE 145*   < > 100*  BUN 44*   < > 78*  CREATININE 2.84*   < > 4.44*  CALCIUM 8.8*   < > 8.4*  MG  --    < > 2.3  AST 19  --   --   ALT 11  --   --   ALKPHOS 46  --   --   BILITOT 1.5*  --   --    < > = values in this interval not displayed.   RADIOLOGY:  No results found. ASSESSMENT AND PLAN:   AKI- likely ATN in the setting of dehydration.  Creatinine worsening. -Renal ultrasound was unremarkable -Will ask nephrology to see -Continue IVFs at 100 ml/hr -Avoid nephrotoxic agents  High grade SBO- not improving. -NG tube in place -NPO -Surgery planning for exploratory laparotomy and lysis of adhesions today -Pain control  Hypertension- normotensive. -Continue labetalol IV prn   Alcohol abuse, possibly with some alcoholic ketoacidosis- patient drinks a couple of 40oz beers per day -CIWA -Start multivitamin, folate, and B12 -Continue IVFs  Tobacco use- smokes 1 ppd -Nicotine patch prn -  Tobacco cessation counseling provided  Now that nephrology is on board for AKI, hospitalists will sign off. Please re-consult with any new concerns.  All the records are reviewed and case discussed with Care Management/Social Worker. Management plans discussed with the patient, family and they are in agreement.  CODE STATUS: Full Code  TOTAL TIME TAKING CARE OF THIS PATIENT: 35 minutes.   More than 50% of the time was spent in  counseling/coordination of care: YES  POSSIBLE D/C IN 3-4 DAYS, DEPENDING ON CLINICAL CONDITION.   Keith Vincent M.D on 08/02/2019 at 2:27 PM  Between 7am to 6pm - Pager [phone]  After 6pm go to [email or web address] - Proofreader  [facility] [place] [facility]  Office  [phone]  CC: Primary care physician; Patient, No Pcp Per  Note: This dictation was prepared with Dragon dictation along with smaller phrase technology. Any transcriptional errors that result from this process are unintentional.

## 2019-08-18 NOTE — Progress Notes (Signed)
Subjective:  CC: Keith Vincent is a 68 y.o. male  Hospital stay day 2, Day of Surgery SBO  HPI: Flatus this am, but pain is worse. Hiccups present and NG still with large output   ROS:  General: Denies weight loss, weight gain, fatigue, fevers, chills, and night sweats. Heart: Denies chest pain, palpitations, racing heart, irregular heartbeat, leg pain or swelling, and decreased activity tolerance. Respiratory: Denies breathing difficulty, shortness of breath, wheezing, cough, and sputum. GI: Denies change in appetite, heartburn,  constipation, diarrhea, and blood in stool. GU: Denies difficulty urinating, pain with urinating, urgency, frequency, blood in urine.   Objective:   Temp:  [98.5 F (36.9 C)-99.1 F (37.3 C)] 98.5 F (36.9 C) (10/23 0416) Pulse Rate:  [102-107] 102 (10/23 0416) Resp:  [16-20] 20 (10/23 0416) BP: (112-123)/(69-89) 123/77 (10/23 0416) SpO2:  [94 %-99 %] 94 % (10/23 0416)     Height: 5\' 9"  (175.3 cm) Weight: 59 kg BMI (Calculated): 19.19   Intake/Output this shift:   Intake/Output Summary (Last 24 hours) at 07/31/2019 0913 Last data filed at 08/14/2019 7353 Gross per 24 hour  Intake 2944.76 ml  Output 3075 ml  Net -130.24 ml    Constitutional :  alert, cooperative, appears stated age and no distress  Respiratory:  clear to auscultation bilaterally  Cardiovascular:  regular rate and rhythm  Gastrointestinal: soft, non-tender; bowel sounds normal; no masses,  no organomegaly.  Slight distention unchanged from previous exam  Skin: Cool and moist.   Psychiatric: Normal affect, non-agitated, not confused       LABS:  CMP Latest Ref Rng & Units 08/01/2019 08/17/2019 01-Sep-2019  Glucose 70 - 99 mg/dL 100(H) 138(H) 145(H)  BUN 8 - 23 mg/dL 78(H) 58(H) 44(H)  Creatinine 0.61 - 1.24 mg/dL 4.44(H) 3.15(H) 2.84(H)  Sodium 135 - 145 mmol/L 141 138 136  Potassium 3.5 - 5.1 mmol/L 3.3(L) 3.5 3.4(L)  Chloride 98 - 111 mmol/L 81(L) 84(L) 88(L)  CO2 22 -  32 mmol/L 36(H) 33(H) 30  Calcium 8.9 - 10.3 mg/dL 8.4(L) 8.6(L) 8.8(L)  Total Protein 6.5 - 8.1 g/dL - - 7.8  Total Bilirubin 0.3 - 1.2 mg/dL - - 1.5(H)  Alkaline Phos 38 - 126 U/L - - 46  AST 15 - 41 U/L - - 19  ALT 0 - 44 U/L - - 11   CBC Latest Ref Rng & Units 08/14/2019 08/17/2019 09-01-19  WBC 4.0 - 10.5 K/uL 6.1 6.8 5.8  Hemoglobin 13.0 - 17.0 g/dL 15.1 15.5 16.8  Hematocrit 39.0 - 52.0 % 45.0 45.2 48.2  Platelets 150 - 400 K/uL 225 222 252    RADS: CLINICAL DATA:  Small-bowel obstruction, 8 hour delayed film  EXAM: PORTABLE ABDOMEN - 1 VIEW  COMPARISON:  Abdominal radiographs, CT abdomen pelvis Sep 01, 2019  FINDINGS: Esophagogastric tube is positioned with tip and side port below the diaphragm. There is enteric contrast in the dependent portions of the gastric fundus and in the descending portion of the duodenum. The proximal small bowel in the left upper quadrant remains extremely distended, maximum caliber of loops approximately 7.1 cm. There is gas present to the distal bowel and colon, with scattered stool in the right hemicolon and scattered gas present to the rectum.  IMPRESSION: 1. Esophagogastric tube is positioned with tip and side port below the diaphragm.  2. There is enteric contrast in the dependent portions of the gastric fundus and in the descending portion of the duodenum, essentially no transit of contrast on 8  hour film.  3. The proximal small bowel in the left upper quadrant remains extremely distended, maximum caliber of loops approximately 7.1 cm. Distal transition point better assessed by prior CT.  4. There is gas present to the distal bowel and colon, with scattered stool in the right hemicolon and scattered gas present to the rectum, findings generally consistent with incomplete bowel obstruction and similar to prior CT.  5. Small volume pneumoperitoneum and portal venous gas identified by prior CT is not noted on  radiographs.   Electronically Signed   By: Lauralyn Primes M.D.   On: 08/17/2019 11:18  Assessment:   SBO, despite flatus this am, exam unchanged, patient remains in discomfort.  With delay film showing above as well, will proceed with ex-lap, LOA.  iscussed pathophisiology and treatment options including NG tube decompression and possible surgery for lysis of adhesions, laparoscopic or open.  The risk of surgery include, but not limited to, recurrence, bleeding, chronic pain, post-op infxn, post-op SBO or ileus, hernias, resection of bowel, re-anastamosis, possible ostomy placement and need for re-operation to address said risks. The risks of general anesthetic, if used, includes MI, CVA, sudden death or even reaction to anesthetic medications also discussed. Alternatives include continued observation and NG decompression.  Benefits include possible symptom relief, preventing further decline in health and possible death.  Typical post-op recovery time of additional days in hospital for observation afterwards also discussed.  The patient verbalized understanding and all questions were answered to the patient's satisfaction.

## 2019-08-18 NOTE — Anesthesia Post-op Follow-up Note (Signed)
Anesthesia QCDR form completed.        

## 2019-08-18 NOTE — Care Management Important Message (Signed)
Important Message  Patient Details  Name: Keith Vincent MRN: [id number] Date of Birth: 04/14/1951   Medicare Important Message Given:  Yes     [name] 08/07/2019, 11:20 AM

## 2019-08-18 NOTE — Anesthesia Procedure Notes (Signed)
Procedure Name: Intubation Date/Time: 20-Aug-2019 2:40 PM Performed by: Allean Found, CRNA Pre-anesthesia Checklist: Patient identified, Patient being monitored, Timeout performed, Emergency Drugs available and Suction available Patient Re-evaluated:Patient Re-evaluated prior to induction Oxygen Delivery Method: Circle system utilized Preoxygenation: Pre-oxygenation with 100% oxygen Induction Type: IV induction Ventilation: Mask ventilation without difficulty Laryngoscope Size: McGraph and 4 Grade View: Grade I Tube type: Oral Tube size: 7.0 mm Number of attempts: 1 Airway Equipment and Method: Stylet Placement Confirmation: ETT inserted through vocal cords under direct vision,  positive ETCO2 and breath sounds checked- equal and bilateral Secured at: 21 cm Tube secured with: Tape Dental Injury: Teeth and Oropharynx as per pre-operative assessment

## 2019-08-18 NOTE — Consult Note (Signed)
[facility]  CONSULT NOTE    Date: 02/02/19                  Patient Name:  Keith MorrisonRobert A Vincent  MRN: [id number]  DOB: 10-14-1951  Age / Sex: 68 y.o., male         PCP: Patient, No Pcp Per                 Service Requesting Consult: Dr. Tonna BoehringerSakai                 Reason for Consult: Acute renal failure            History of Present Illness: Mr. Keith Vincent admitted to [facility] on 10/21 for small bowel obstruction. No IV contrast. Patient states he has not been eating well and has had several days of nausea and vomiting before admission. Has been kept with NG tube to suction and NPO. Nephrology consulted for worsening renal function.    Medications: Outpatient medications: No medications prior to admission.    Current medications: Current Facility-Administered Medications  Medication Dose Route Frequency Provider Last Rate Last Dose  . [MAR Hold] folic acid (FOLVITE) tablet 1 mg  1 mg Oral Daily Mayo, Allyn KennerKaty Dodd, MD      . Mitzi Hansen[MAR Hold] heparin injection 5,000 Units  5,000 Units Subcutaneous Q8H Mayo, Allyn KennerKaty Dodd, MD   5,000 Units at 02/14/2019 0515  . [MAR Hold] labetalol (NORMODYNE) injection 5 mg  5 mg Intravenous Q6H PRN Mayo, Allyn KennerKaty Dodd, MD      . lactated ringers infusion   Intravenous Continuous Tonna BoehringerSakai, Isami, DO 100 mL/hr at 02/14/2019 [id number]    . [MAR Hold] LORazepam (ATIVAN) tablet 1-4 mg  1-4 mg Oral Q1H PRN Mayo, Allyn KennerKaty Dodd, MD       Or  . Mitzi Hansen[MAR Hold] LORazepam (ATIVAN) injection 1-4 mg  1-4 mg Intravenous Q1H PRN Mayo, Allyn KennerKaty Dodd, MD      . Mitzi Hansen[MAR Hold] morphine 2 MG/ML injection 1 mg  1 mg Intravenous Q3H PRN Tonna BoehringerSakai, Isami, DO   1 mg at 02/14/2019 0903  . [MAR Hold] multivitamin with minerals tablet 1 tablet  1 tablet Oral Daily Mayo, Allyn KennerKaty Dodd, MD      . Mitzi Hansen[MAR Hold] nicotine (NICODERM CQ - dosed in mg/24 hours) patch 21 mg  21 mg Transdermal Daily PRN Mayo, Allyn KennerKaty Dodd, MD      . Mitzi Hansen[MAR Hold] ondansetron (ZOFRAN-ODT) disintegrating tablet 4 mg  4 mg Oral Q6H PRN Tonna BoehringerSakai,  Isami, DO       Or  . Mitzi Hansen[MAR Hold] ondansetron (ZOFRAN) injection 4 mg  4 mg Intravenous Q6H PRN Sakai, Isami, DO   4 mg at 08/17/19 0049  . [MAR Hold] thiamine (VITAMIN B-1) tablet 100 mg  100 mg Oral Daily Mayo, Allyn KennerKaty Dodd, MD       Or  . Mitzi Hansen[MAR Hold] thiamine (B-1) injection 100 mg  100 mg Intravenous Daily Mayo, Allyn KennerKaty Dodd, MD          Allergies: No Known Allergies    Past Medical History: History reviewed. No pertinent past medical history.   Past Surgical History: History reviewed. No pertinent surgical history.   Family History: History reviewed. No pertinent family history.   Social History: Social History   Socioeconomic History  . Marital status: Single    Spouse name: Not on file  . Number of children: Not on file  . Years of education: Not on file  . Highest  education level: Not on file  Occupational History  . Not on file  Social Needs  . Financial resource strain: Not on file  . Food insecurity    Worry: Not on file    Inability: Not on file  . Transportation needs    Medical: Not on file    Non-medical: Not on file  Tobacco Use  . Smoking status: Current Every Day Smoker  Substance and Sexual Activity  . Alcohol use: No  . Drug use: Not on file  . Sexual activity: Not on file  Lifestyle  . Physical activity    Days per week: Not on file    Minutes per session: Not on file  . Stress: Not on file  Relationships  . Social Musician on phone: Not on file    Gets together: Not on file    Attends religious service: Not on file    Active member of club or organization: Not on file    Attends meetings of clubs or organizations: Not on file    Relationship status: Not on file  . Intimate partner violence    Fear of current or ex partner: Not on file    Emotionally abused: Not on file    Physically abused: Not on file    Forced sexual activity: Not on file  Other Topics Concern  . Not on file  Social History Narrative   Lives alone      Review of Systems: Review of Systems  Constitutional: Negative.  Negative for chills, diaphoresis, fever, malaise/fatigue and weight loss.  HENT: Negative.  Negative for congestion, ear discharge, ear pain, hearing loss, nosebleeds, sinus pain, sore throat and tinnitus.   Eyes: Negative.  Negative for blurred vision, double vision, photophobia, pain, discharge and redness.  Respiratory: Negative.  Negative for cough, hemoptysis, sputum production, shortness of breath, wheezing and stridor.   Cardiovascular: Negative.  Negative for chest pain, palpitations, orthopnea, claudication, leg swelling and PND.  Gastrointestinal: Positive for abdominal pain, constipation, heartburn and nausea. Negative for blood in stool, diarrhea, melena and vomiting.  Genitourinary: Negative.  Negative for dysuria, flank pain, frequency, hematuria and urgency.  Musculoskeletal: Negative.  Negative for back pain, falls, joint pain, myalgias and neck pain.  Skin: Negative.  Negative for itching and rash.  Neurological: Negative.  Negative for dizziness, tingling, tremors, sensory change, speech change, focal weakness, seizures, loss of consciousness, weakness and headaches.  Endo/Heme/Allergies: Negative.  Negative for environmental allergies and polydipsia. Does not bruise/bleed easily.  Psychiatric/Behavioral: Negative.  Negative for depression, hallucinations, memory loss, substance abuse and suicidal ideas. The patient is not nervous/anxious and does not have insomnia.     Vital Signs: Blood pressure 123/88, pulse (!) 103, temperature (!) 96.9 F (36.1 C), temperature source Tympanic, resp. rate 16, height  (1.753 m), weight 59 kg, SpO2 94 %.  Weight trends: Filed Weights   08/22/2019 1546 05-Sep-2019 1330  Weight: 59 kg 59 kg    Physical Exam: General: NAD,   Head: +NGT   Eyes: Anicteric, PERRL   Neck: Supple, trachea midline  Lungs:  Clear to auscultation  Heart: Regular rate and rhythm   Abdomen:  Soft, nontender,   Extremities:  no peripheral edema.  Neurologic: Nonfocal, moving all four extremities  Skin: No lesions         Lab results: Basic Metabolic Panel: Recent Labs  Lab 08/24/2019 1710 08/17/19 0516 09-05-2019 0524  NA 136 138 141  K 3.4* 3.5 3.3*  CL 88* 84* 81*  CO2 30 33* 36*  GLUCOSE 145* 138* 100*  BUN 44* 58* 78*  CREATININE 2.84* 3.15* 4.44*  CALCIUM 8.8* 8.6* 8.4*  MG  --  2.0 2.3  PHOS  --  6.6* 6.9*    Liver Function Tests: Recent Labs  Lab 07/31/2019 1710  AST 19  ALT 11  ALKPHOS 46  BILITOT 1.5*  PROT 7.8  ALBUMIN 4.4   Recent Labs  Lab 07/27/2019 1710  LIPASE 22   No results for input(s): AMMONIA in the last 168 hours.  CBC: Recent Labs  Lab 07/27/2019 1551 08/17/19 0516 08/17/2019 0524  WBC 5.8 6.8 6.1  HGB 16.8 15.5 15.1  HCT 48.2 45.2 45.0  MCV 96.2 95.2 98.3  PLT 252 222 225    Cardiac Enzymes: No results for input(s): CKTOTAL, CKMB, CKMBINDEX, TROPONINI in the last 168 hours.  BNP: Invalid input(s): POCBNP  CBG: No results for input(s): GLUCAP in the last 168 hours.  Microbiology: Results for orders placed or performed during the hospital encounter of 08/14/2019  SARS Coronavirus 2 by RT PCR (hospital order, performed in [facility] hospital lab) Nasopharyngeal Nasopharyngeal Swab     Status: None   Collection Time: 07/27/2019  6:54 PM   Specimen: Nasopharyngeal Swab  Result Value Ref Range Status   SARS Coronavirus 2 NEGATIVE NEGATIVE Final    Comment: (NOTE) If result is NEGATIVE SARS-CoV-2 target nucleic acids are NOT DETECTED. The SARS-CoV-2 RNA is generally detectable in upper and lower  respiratory specimens during the acute phase of infection. The lowest  concentration of SARS-CoV-2 viral copies this assay can detect is 250  copies / mL. A negative result does not preclude SARS-CoV-2 infection  and should not be used as the sole basis for treatment or other  patient management decisions.  A negative  result may occur with  improper specimen collection / handling, submission of specimen other  than nasopharyngeal swab, presence of viral mutation(s) within the  areas targeted by this assay, and inadequate number of viral copies  (<250 copies / mL). A negative result must be combined with clinical  observations, patient history, and epidemiological information. If result is POSITIVE SARS-CoV-2 target nucleic acids are DETECTED. The SARS-CoV-2 RNA is generally detectable in upper and lower  respiratory specimens dur ing the acute phase of infection.  Positive  results are indicative of active infection with SARS-CoV-2.  Clinical  correlation with patient history and other diagnostic information is  necessary to determine patient infection status.  Positive results do  not rule out bacterial infection or co-infection with other viruses. If result is PRESUMPTIVE POSTIVE SARS-CoV-2 nucleic acids MAY BE PRESENT.   A presumptive positive result was obtained on the submitted specimen  and confirmed on repeat testing.  While 2019 novel coronavirus  (SARS-CoV-2) nucleic acids may be present in the submitted sample  additional confirmatory testing may be necessary for epidemiological  and / or clinical management purposes  to differentiate between  SARS-CoV-2 and other Sarbecovirus currently known to infect humans.  If clinically indicated additional testing with an alternate test  methodology [phone]) is advised. The SARS-CoV-2 RNA is generally  detectable in upper and lower respiratory sp ecimens during the acute  phase of infection. The expected result is Negative. Fact Sheet for Patients:  BoilerBrush.com.cy Fact Sheet for Healthcare Providers: https://pope.com/ This test is not yet approved or cleared by the [place] FDA and has been authorized for detection and/or diagnosis of SARS-CoV-2 by FDA  under an Emergency Use Authorization  (EUA).  This EUA will remain in effect (meaning this test can be used) for the duration of the COVID-19 declaration under Section 564(b)(1) of the Act, 21 U.S.C. section 360bbb-3(b)(1), unless the authorization is terminated or revoked sooner. Performed at [facility], [address] [place], [place] [address]     Coagulation Studies: No results for input(s): LABPROT, INR in the last 72 hours.  Urinalysis: Recent Labs    08/17/19 2042  COLORURINE YELLOW*  LABSPEC 1.017  PHURINE 6.0  GLUCOSEU NEGATIVE  HGBUR NEGATIVE  BILIRUBINUR NEGATIVE  KETONESUR 5*  PROTEINUR 30*  NITRITE NEGATIVE  LEUKOCYTESUR NEGATIVE      Imaging: Ct Abdomen Pelvis Wo Contrast  Result Date: August 22, 2019 CLINICAL DATA:  Abdominal pain and nausea and vomiting for 5 days. EXAM: CT ABDOMEN AND PELVIS WITHOUT CONTRAST TECHNIQUE: Multidetector CT imaging of the abdomen and pelvis was performed following the standard protocol without IV contrast. COMPARISON:  None. FINDINGS: Lower chest: No acute findings. Hepatobiliary: Several small low-attenuation lesions are seen in the left hepatic lobe measuring up to 1.4 cm in size, but cannot be characterized on this noncontrast study. Portal venous gas is seen throughout the right and left lobes. Gallbladder is unremarkable. Pancreas: No mass or inflammatory process visualized on this unenhanced exam. Spleen:  Within normal limits in size. Adrenals/Urinary tract: No evidence of urolithiasis or hydronephrosis. Unremarkable unopacified urinary bladder. Stomach/Bowel: Markedly dilated fluid-filled small bowel loops are seen throughout the abdomen and pelvis, with transition point in the region of the right lower quadrant. Pneumatosis is seen within dilated distal small bowel loops in the right lower abdomen. This is suspicious for bowel ischemia. Colon is nondilated. A few tiny air bubbles are seen just deep to the right rectus abdominus muscle, suspicious for free  intraperitoneal air. No evidence of abscess. Vascular/Lymphatic: No pathologically enlarged lymph nodes identified. No evidence of abdominal aortic aneurysm. Aortic atherosclerosis. Reproductive:  No mass or other significant abnormality. Other:  None. Musculoskeletal:  No suspicious bone lesions identified. IMPRESSION: High-grade distal small bowel obstruction. Pneumatosis within dilated distal small bowel loops and portal venous gas in liver are highly suspicious for bowel ischemia. Probable tiny amount of free intraperitoneal air, highly suspicious for bowel perforation. Several small low-attenuation lesions in left hepatic lobe which cannot be characterized on this noncontrast study. Recommend further attention on follow-up CT or with MRI. Aortic Atherosclerosis (ICD10-I70.0). Critical Value/emergent results were called by telephone at the time of interpretation on Aug 22, 2019 at 6:50 pm to provider MARK QUALE , who verbally acknowledged these results. Electronically Signed   By: Marlaine Hind M.D.   On: 08/22/19 18:50   US Renal  Result Date: 08/17/2019 CLINICAL DATA:  Acute kidney injury. EXAM: RENAL / URINARY TRACT ULTRASOUND COMPLETE COMPARISON:  CT abdomen and pelvis 08-22-2019. FINDINGS: Right Kidney: Renal measurements: 10.1 x 6.1 x 5.9 cm = volume: 188.4 mL . Echogenicity within normal limits. No mass or hydronephrosis visualized. Left Kidney: Renal measurements: 9.4 x 4.1 x 5.2 cm = volume: 104.9 mL. Echogenicity within normal limits. No mass or hydronephrosis visualized. Bladder: Appears normal for degree of bladder distention. Other: None. IMPRESSION: Negative for hydronephrosis.  Negative exam. Electronically Signed   By: Inge Rise M.D.   On: 08/17/2019 11:01   Dg Abd Portable 1v-small Bowel Obstruction Protocol-initial, 8 Hr Delay  Result Date: 08/17/2019 CLINICAL DATA:  Small-bowel obstruction, 8 hour delayed film EXAM: PORTABLE ABDOMEN - 1 VIEW COMPARISON:  Abdominal radiographs,  CT abdomen pelvis 08/05/2019 FINDINGS: Esophagogastric tube is positioned with tip and side port below the diaphragm. There is enteric contrast in the dependent portions of the gastric fundus and in the descending portion of the duodenum. The proximal small bowel in the left upper quadrant remains extremely distended, maximum caliber of loops approximately 7.1 cm. There is gas present to the distal bowel and colon, with scattered stool in the right hemicolon and scattered gas present to the rectum. IMPRESSION: 1. Esophagogastric tube is positioned with tip and side port below the diaphragm. 2. There is enteric contrast in the dependent portions of the gastric fundus and in the descending portion of the duodenum, essentially no transit of contrast on 8 hour film. 3. The proximal small bowel in the left upper quadrant remains extremely distended, maximum caliber of loops approximately 7.1 cm. Distal transition point better assessed by prior CT. 4. There is gas present to the distal bowel and colon, with scattered stool in the right hemicolon and scattered gas present to the rectum, findings generally consistent with incomplete bowel obstruction and similar to prior CT. 5. Small volume pneumoperitoneum and portal venous gas identified by prior CT is not noted on radiographs. Electronically Signed   By: Lauralyn Primes M.D.   On: 08/17/2019 11:18   Dg Abd Portable 1v-small Bowel Protocol-position Verification  Result Date: 08/17/2019 CLINICAL DATA:  NG tube placement EXAM: PORTABLE ABDOMEN - 1 VIEW COMPARISON:  None. FINDINGS: Tip of the NG tube is seen projecting over the mid body of the stomach. Mildly dilated air-filled loops of small bowel in the mid abdomen. IMPRESSION: Tip of the NG tube seen projecting over the mid body of the stomach. Electronically Signed   By: Jonna Clark M.D.   On: 08/17/2019 02:50      Assessment & Plan: Mr. DEVONTRE SIEDSCHLAG is a 68 y.o. black male with alcoholic hepatic cirrhosis,  GERD/peptic ulcer disease, , who was admitted to [facility] on 08/09/2019 for SBO (small bowel obstruction) (HCC) [K56.609] AKI (acute kidney injury) (HCC) [N17.9]  Patient scheduled for bowel surgery later today.   1. Acute renal failure: no recent creatinine to determine baseline. Normal kidney function is 2017.  Proteinuria on urinalysis.  No IV contrast exposure AKI secondary to ATN.  - Continue IV fluids  2. Hypokalemia and metabolic alkalosis: secondary to GI losses.   LOS: 2 Bobette Leyh 10/23/20202:48 PM

## 2019-08-18 NOTE — Anesthesia Preprocedure Evaluation (Signed)
Anesthesia Evaluation  Patient identified by MRN, date of birth, ID band Patient awake    Reviewed: Allergy & Precautions, NPO status , Patient's Chart, lab work & pertinent test results  History of Anesthesia Complications Negative for: history of anesthetic complications  Airway Mallampati: II  TM Distance: >3 FB Neck ROM: Full    Dental  (+) Upper Dentures, Lower Dentures   Pulmonary neg sleep apnea, neg COPD, Current Smoker and Patient abstained from smoking.,    breath sounds clear to auscultation- rhonchi (-) wheezing      Cardiovascular (-) hypertension(-) CAD, (-) Past MI, (-) Cardiac Stents and (-) CABG  Rhythm:Regular Rate:Normal - Systolic murmurs and - Diastolic murmurs    Neuro/Psych neg Seizures negative neurological ROS  negative psych ROS   GI/Hepatic negative GI ROS, Neg liver ROS,   Endo/Other  negative endocrine ROSneg diabetes  Renal/GU ARFRenal disease     Musculoskeletal negative musculoskeletal ROS (+)   Abdominal (+) - obese,   Peds  Hematology negative hematology ROS (+)   Anesthesia Other Findings    Reproductive/Obstetrics                             Anesthesia Physical Anesthesia Plan  ASA: II  Anesthesia Plan: General   Post-op Pain Management:    Induction: Intravenous  PONV Risk Score and Plan: 0 and Ondansetron  Airway Management Planned: Oral ETT  Additional Equipment:   Intra-op Plan:   Post-operative Plan: Extubation in OR  Informed Consent: I have reviewed the patients History and Physical, chart, labs and discussed the procedure including the risks, benefits and alternatives for the proposed anesthesia with the patient or authorized representative who has indicated his/her understanding and acceptance.     Dental advisory given  Plan Discussed with: CRNA and Anesthesiologist  Anesthesia Plan Comments:         Anesthesia Quick  Evaluation

## 2019-08-18 NOTE — Transfer of Care (Signed)
Immediate Anesthesia Transfer of Care Note  Patient: Keith Vincent  Procedure(s) Performed: EXPLORATORY LAPAROTOMY (N/A Abdomen) LYSIS OF ADHESION (N/A Abdomen)  Patient Location: PACU  Anesthesia Type:General  Level of Consciousness: sedated  Airway & Oxygen Therapy: Patient connected to face mask oxygen  Post-op Assessment: Post -op Vital signs reviewed and stable  Post vital signs: stable  Last Vitals:  Vitals Value Taken Time  BP 140/76 08/01/2019 1702  Temp    Pulse 96 08/13/2019 1703  Resp 31 08/03/2019 1703  SpO2 94 % 08/25/2019 1703  Vitals shown include unvalidated device data.  Last Pain:  Vitals:   08/17/2019 1317  TempSrc: Tympanic  PainSc: 5          Complications: No apparent anesthesia complications

## 2019-08-18 NOTE — Progress Notes (Signed)
Sat on 3 liters oxygen range 91-93  Lungs clear with poor effort   Encouraging deep breathing  But not cooperating well  Awaken easily   Dr Rosey Bath called  Okay for pt to go to room

## 2019-08-19 ENCOUNTER — Encounter: Payer: Self-pay | Admitting: Surgery

## 2019-08-19 LAB — BASIC METABOLIC PANEL
Anion gap: 17 — ABNORMAL HIGH (ref 5–15)
BUN: 86 mg/dL — ABNORMAL HIGH (ref 8–23)
CO2: 34 mmol/L — ABNORMAL HIGH (ref 22–32)
Calcium: 8.2 mg/dL — ABNORMAL LOW (ref 8.9–10.3)
Chloride: 94 mmol/L — ABNORMAL LOW (ref 98–111)
Creatinine, Ser: 3.97 mg/dL — ABNORMAL HIGH (ref 0.61–1.24)
GFR calc Af Amer: 17 mL/min — ABNORMAL LOW (ref >60)
GFR calc non Af Amer: 15 mL/min — ABNORMAL LOW (ref >60)
Glucose, Bld: 104 mg/dL — ABNORMAL HIGH (ref 70–99)
Potassium: 3.7 mmol/L (ref 3.5–5.1)
Sodium: 145 mmol/L (ref 135–145)

## 2019-08-19 LAB — PHOSPHORUS: Phosphorus: 6 mg/dL — ABNORMAL HIGH (ref 2.5–4.6)

## 2019-08-19 LAB — CBC
HCT: 43.2 % (ref 39.0–52.0)
Hemoglobin: 14 g/dL (ref 13.0–17.0)
MCH: 32.5 pg (ref 26.0–34.0)
MCHC: 32.4 g/dL (ref 30.0–36.0)
MCV: 100.2 fL — ABNORMAL HIGH (ref 80.0–100.0)
Platelets: 226 10*3/uL (ref 150–400)
RBC: 4.31 MIL/uL (ref 4.22–5.81)
RDW: 11.8 % (ref 11.5–15.5)
WBC: 9.5 10*3/uL (ref 4.0–10.5)
nRBC: 0 % (ref 0.0–0.2)

## 2019-08-19 LAB — MAGNESIUM: Magnesium: 2.4 mg/dL (ref 1.7–2.4)

## 2019-08-19 NOTE — Progress Notes (Addendum)
Keith Vincent  MRN: [id number]  DOB/AGE: Aug 27, 1951 68 y.o.  Primary Care Physician:Patient, No Pcp Per  Admit date: 08/19/2019  Chief Complaint:  Chief Complaint  Patient presents with  . Nausea    S-Pt presented on  07/29/2019 with  Chief Complaint  Patient presents with  . Nausea  .    Pt today feels better. Pt says " I am better that what I came in with"   Meds   . folic acid  1 mg Oral Daily  . heparin injection (subcutaneous)  5,000 Units Subcutaneous Q8H  . multivitamin with minerals  1 tablet Oral Daily  . thiamine  100 mg Oral Daily   Or  . thiamine  100 mg Intravenous Daily         EXN:TZGYF from the symptoms mentioned above,there are no other symptoms referable to all systems reviewed.  Physical Exam: Vital signs in last 24 hours: Temp:  [96.9 F (36.1 C)-99.3 F (37.4 C)] 98.4 F (36.9 C) (10/24 0535) Pulse Rate:  [94-113] 111 (10/24 0535) Resp:  [16-35] 17 (10/24 0535) BP: (110-144)/(71-104) 110/76 (10/24 0535) SpO2:  [85 %-97 %] 96 % (10/24 0535) Weight:  [59 kg] 59 kg (10/23 1330) Weight change:     Intake/Output from previous day: 10/23 0701 - 10/24 0700 In: 3476.1 [I.V.:3476.1] Out: 1850 [Urine:600; Emesis/NG output:1250] Total I/O In: -  Out: 250 [Emesis/NG output:250]   Physical Exam: General- pt is awake,alert, oriented to time place and person Resp- No acute REsp distress, CTA B/L NO Rhonchi CVS- S1S2 regular in rate and rhythm GIT- BS reduced, soft, minimal tenderness over incision site  EXT- NO LE Edema, NO Cyanosis   Lab Results: CBC Recent Labs    08/24/2019 0524 08/19/19 0545  WBC 6.1 9.5  HGB 15.1 14.0  HCT 45.0 43.2  PLT 225 226    BMET Recent Labs    08/09/2019 0524 08/19/19 0545  NA 141 145  K 3.3* 3.7  CL 81* 94*  CO2 36* 34*  GLUCOSE 100* 104*  BUN 78* 86*  CREATININE 4.44* 3.97*  CALCIUM 8.4* 8.2*   Creat trend 2020    2.8==> 4.4==> 3.97      MICRO Recent Results (from the past 240  hour(s))  SARS Coronavirus 2 by RT PCR (hospital order, performed in [facility] hospital lab) Nasopharyngeal Nasopharyngeal Swab     Status: None   Collection Time: 08/25/2019  6:54 PM   Specimen: Nasopharyngeal Swab  Result Value Ref Range Status   SARS Coronavirus 2 NEGATIVE NEGATIVE Final    Comment: (NOTE) If result is NEGATIVE SARS-CoV-2 target nucleic acids are NOT DETECTED. The SARS-CoV-2 RNA is generally detectable in upper and lower  respiratory specimens during the acute phase of infection. The lowest  concentration of SARS-CoV-2 viral copies this assay can detect is 250  copies / mL. A negative result does not preclude SARS-CoV-2 infection  and should not be used as the sole basis for treatment or other  patient management decisions.  A negative result may occur with  improper specimen collection / handling, submission of specimen other  than nasopharyngeal swab, presence of viral mutation(s) within the  areas targeted by this assay, and inadequate number of viral copies  (<250 copies / mL). A negative result must be combined with clinical  observations, patient history, and epidemiological information. If result is POSITIVE SARS-CoV-2 target nucleic acids are DETECTED. The SARS-CoV-2 RNA is generally detectable in upper and lower  respiratory specimens dur  ing the acute phase of infection.  Positive  results are indicative of active infection with SARS-CoV-2.  Clinical  correlation with patient history and other diagnostic information is  necessary to determine patient infection status.  Positive results do  not rule out bacterial infection or co-infection with other viruses. If result is PRESUMPTIVE POSTIVE SARS-CoV-2 nucleic acids MAY BE PRESENT.   A presumptive positive result was obtained on the submitted specimen  and confirmed on repeat testing.  While 2019 novel coronavirus  (SARS-CoV-2) nucleic acids may be present in the submitted sample  additional confirmatory  testing may be necessary for epidemiological  and / or clinical management purposes  to differentiate between  SARS-CoV-2 and other Sarbecovirus currently known to infect humans.  If clinically indicated additional testing with an alternate test  methodology [phone]) is advised. The SARS-CoV-2 RNA is generally  detectable in upper and lower respiratory sp ecimens during the acute  phase of infection. The expected result is Negative. Fact Sheet for Patients:  StrictlyIdeas.no Fact Sheet for Healthcare Providers: BankingDealers.co.za This test is not yet approved or cleared by the [place] FDA and has been authorized for detection and/or diagnosis of SARS-CoV-2 by FDA under an Emergency Use Authorization (EUA).  This EUA will remain in effect (meaning this test can be used) for the duration of the COVID-19 declaration under Section 564(b)(1) of the Act, 21 U.S.C. section 360bbb-3(b)(1), unless the authorization is terminated or revoked sooner. Performed at [facility], [address], [place], [place] [address]       Lab Results  Component Value Date   CALCIUM 8.2 (L) 08/19/2019   PHOS 6.0 (H) 08/19/2019               Impression:  Mr. Keith Vincent is a 68 y.o.  African-American male with alcoholic hepatic cirrhosis, GERD/peptic ulcer disease, , who was admitted to [facility] on 09-08-19 for SBO (small bowel obstruction),AKI (acute kidney injury) .  Patient underwent  bowel surgery yesterday.   1)Renal  AKI secondary to ATN                AKI secondary to hypovolemia                No sign of fluid overload/hyperkalemia/acidosis                AKI little better, creatinine is trending down  2) hypotension     Blood pressure on lower side but stable, no need for vasopressors   3)Anemia none    4) secondary hyperparathyroidism CKD Mineral-Bone Disorder  Secondary Hyperparathyroidism  present Phosphorus high Will not start binders as patient is postop  5) small bowel obstruction Patient was admitted with small bowel obstruction. Patient is now status post exploratory laparotomy with lysis of adhesions  6) hypokalemia  Now better  7) Alkalosis Secondary to upper GI losses Co2 trending down    Plan:   We will continue current treatment plan   Benny Henrie s [facility] 08/19/2019, 10:42 AM

## 2019-08-19 NOTE — Progress Notes (Signed)
Patient refuses bed alarm. 

## 2019-08-19 NOTE — Progress Notes (Signed)
[facility] Hospital Day(s): 3.   Post op day(s): 1 Day Post-Op.   Interval History: Patient seen and examined, no acute events or new complaints overnight. Patient reports having pain at surgical area, denies nausea or vomiting.  Patient wishes to be in place.  Vital signs in last 24 hours: [min-max] current  Temp:  [96.9 F (36.1 C)-99.3 F (37.4 C)] 98.3 F (36.8 C) (10/24 1114) Pulse Rate:  [94-113] 103 (10/24 1114) Resp:  [16-35] 17 (10/24 0535) BP: (110-144)/(71-104) 138/81 (10/24 1114) SpO2:  [85 %-97 %] 97 % (10/24 1114) Weight:  [59 kg] 59 kg (10/23 1330)     Height: 5\' 9"  (175.3 cm) Weight: 59 kg BMI (Calculated): 19.2   Physical Exam:  Constitutional: alert, cooperative and no distress  Respiratory: breathing non-labored at rest  Cardiovascular: regular rate and sinus rhythm  Gastrointestinal: soft, tender over incision site, and mild-distended  Labs:  CBC Latest Ref Rng & Units 08/19/2019 08-31-19 08/17/2019  WBC 4.0 - 10.5 K/uL 9.5 6.1 6.8  Hemoglobin 13.0 - 17.0 g/dL 14.0 15.1 15.5  Hematocrit 39.0 - 52.0 % 43.2 45.0 45.2  Platelets 150 - 400 K/uL 226 225 222   CMP Latest Ref Rng & Units 08/19/2019 2019/08/31 08/17/2019  Glucose 70 - 99 mg/dL 104(H) 100(H) 138(H)  BUN 8 - 23 mg/dL 86(H) 78(H) 58(H)  Creatinine 0.61 - 1.24 mg/dL 3.97(H) 4.44(H) 3.15(H)  Sodium 135 - 145 mmol/L 145 141 138  Potassium 3.5 - 5.1 mmol/L 3.7 3.3(L) 3.5  Chloride 98 - 111 mmol/L 94(L) 81(L) 84(L)  CO2 22 - 32 mmol/L 34(H) 36(H) 33(H)  Calcium 8.9 - 10.3 mg/dL 8.2(L) 8.4(L) 8.6(L)  Total Protein 6.5 - 8.1 g/dL - - -  Total Bilirubin 0.3 - 1.2 mg/dL - - -  Alkaline Phos 38 - 126 U/L - - -  AST 15 - 41 U/L - - -  ALT 0 - 44 U/L - - -    Imaging studies: No new pertinent imaging studies   Assessment/Plan:  67 y.o. male with small bowel obstruction 1 Day Post-Op s/p exploratory laparotomy with extensive lysis of adhesions, complicated by pertinent comorbidities  including acute kidney injury secondary to acute tubular necrosis and hypovolemia GERD and alcoholism. Patient recovering slowly from surgery.  Pain is expected after surgery.  Still without bowel function return.  We will continue with NGT and IV fluid.  I appreciate hospitalist and nephrology management and recommendations.  Slowly improving renal function.  Normal white blood cell count.  Stable hemoglobin.  We will continue with DVT prophylaxis.  I encouraged the patient to ambulate.  I encourage patient to perform incentive spirometer.  Arnold Long, MD

## 2019-08-20 ENCOUNTER — Inpatient Hospital Stay: Payer: Medicare Other

## 2019-08-20 LAB — BASIC METABOLIC PANEL
Anion gap: 26 — ABNORMAL HIGH (ref 5–15)
BUN: 87 mg/dL — ABNORMAL HIGH (ref 8–23)
CO2: 33 mmol/L — ABNORMAL HIGH (ref 22–32)
Calcium: 8.9 mg/dL (ref 8.9–10.3)
Chloride: 92 mmol/L — ABNORMAL LOW (ref 98–111)
Creatinine, Ser: 3.35 mg/dL — ABNORMAL HIGH (ref 0.61–1.24)
GFR calc Af Amer: 21 mL/min — ABNORMAL LOW (ref >60)
GFR calc non Af Amer: 18 mL/min — ABNORMAL LOW (ref >60)
Glucose, Bld: 96 mg/dL (ref 70–99)
Potassium: 3.1 mmol/L — ABNORMAL LOW (ref 3.5–5.1)
Sodium: 151 mmol/L — ABNORMAL HIGH (ref 135–145)

## 2019-08-20 LAB — BLOOD GAS, ARTERIAL
Acid-Base Excess: 24.1 mmol/L — ABNORMAL HIGH (ref 0.0–2.0)
Bicarbonate: 49.7 mmol/L — ABNORMAL HIGH (ref 20.0–28.0)
FIO2: 0.76
O2 Saturation: 99.2 %
Patient temperature: 37
pCO2 arterial: 53 mmHg — ABNORMAL HIGH (ref 32.0–48.0)
pH, Arterial: 7.58 — ABNORMAL HIGH (ref 7.350–7.450)
pO2, Arterial: 122 mmHg — ABNORMAL HIGH (ref 83.0–108.0)

## 2019-08-20 LAB — GLUCOSE, CAPILLARY: Glucose-Capillary: 108 mg/dL — ABNORMAL HIGH (ref 70–99)

## 2019-08-20 LAB — PHOSPHORUS: Phosphorus: 4.3 mg/dL (ref 2.5–4.6)

## 2019-08-20 LAB — CBC
HCT: 40.3 % (ref 39.0–52.0)
Hemoglobin: 12.9 g/dL — ABNORMAL LOW (ref 13.0–17.0)
MCH: 32.8 pg (ref 26.0–34.0)
MCHC: 32 g/dL (ref 30.0–36.0)
MCV: 102.5 fL — ABNORMAL HIGH (ref 80.0–100.0)
Platelets: 226 10*3/uL (ref 150–400)
RBC: 3.93 MIL/uL — ABNORMAL LOW (ref 4.22–5.81)
RDW: 11.9 % (ref 11.5–15.5)
WBC: 9.4 10*3/uL (ref 4.0–10.5)
nRBC: 0 % (ref 0.0–0.2)

## 2019-08-20 LAB — HEMOGLOBIN AND HEMATOCRIT, BLOOD
HCT: 37.4 % — ABNORMAL LOW (ref 39.0–52.0)
Hemoglobin: 11.8 g/dL — ABNORMAL LOW (ref 13.0–17.0)

## 2019-08-20 LAB — MAGNESIUM: Magnesium: 2.6 mg/dL — ABNORMAL HIGH (ref 1.7–2.4)

## 2019-08-20 LAB — MRSA PCR SCREENING: MRSA by PCR: NEGATIVE

## 2019-08-20 MED ORDER — POTASSIUM CHLORIDE CRYS ER 20 MEQ PO TBCR: 40.0000 meq | EXTENDED_RELEASE_TABLET | Freq: Once | ORAL | Status: DC

## 2019-08-20 MED ORDER — ORAL CARE MOUTH RINSE
15.0000 mL | Freq: Two times a day (BID) | OROMUCOSAL | Status: DC
  Administered 2019-08-21 – 2019-08-22 (×3): 15 mL via OROMUCOSAL

## 2019-08-20 MED ORDER — KCL IN DEXTROSE-NACL 20-5-0.45 MEQ/L-%-% IV SOLN: INTRAVENOUS | Status: DC

## 2019-08-20 MED ORDER — CHLORHEXIDINE GLUCONATE CLOTH 2 % EX PADS
6.0000 | MEDICATED_PAD | Freq: Every day | CUTANEOUS | Status: DC
  Administered 2019-08-21 – 2019-08-23 (×3): 6 via TOPICAL

## 2019-08-20 MED ORDER — POTASSIUM CHLORIDE IN NACL 20-0.45 MEQ/L-% IV SOLN
INTRAVENOUS | Status: DC
  Administered 2019-08-20 (×2): via INTRAVENOUS
  Filled 2019-08-20 (×2): qty 1000

## 2019-08-20 MED ORDER — KCL IN DEXTROSE-NACL 40-5-0.45 MEQ/L-%-% IV SOLN
INTRAVENOUS | Status: DC
  Administered 2019-08-20: 18:00:00 via INTRAVENOUS
  Filled 2019-08-20 (×3): qty 1000

## 2019-08-20 MED ORDER — CHLORHEXIDINE GLUCONATE 0.12 % MT SOLN
15.0000 mL | Freq: Two times a day (BID) | OROMUCOSAL | Status: DC
  Administered 2019-08-20 – 2019-08-22 (×5): 15 mL via OROMUCOSAL
  Filled 2019-08-20: qty 15

## 2019-08-20 NOTE — Consult Note (Signed)
CRITICAL CARE PROGRESS NOTE    Name: Keith Vincent MRN: [id number] DOB: Jun 09, 1951     LOS: 4   SUBJECTIVE FINDINGS & SIGNIFICANT EVENTS   Patient description:  This a pleasant 68 year old male with a history of GERD, chronic alcoholism, lifelong smoking, chronic anemia electrolyte imbalances, small bowel obstruction currently postop day 2 status post ex lap with extensive lysis of adhesions, development of AKI and acute hypoxemic respiratory failure.  Critical care consultation placed by surgery team for management of acute hypoxemic respiratory failure and multiple comorbid conditions.   Lines / Drains: PIV x2  Cultures / Sepsis markers: Blood cultures x2 MRSA PCR  Antibiotics: Rocephin and Zithromax   Protocols / Consultants: Surgery and critical care  Tests / Events: Chest x-ray   PAST MEDICAL HISTORY   History reviewed. No pertinent past medical history.   SURGICAL HISTORY   Past Surgical History:  Procedure Laterality Date  . LAPAROTOMY N/A 08/06/2019   Procedure: EXPLORATORY LAPAROTOMY;  Surgeon: Benjamine Sprague, DO;  Location: [facility] ORS;  Service: General;  Laterality: N/A;  . LYSIS OF ADHESION N/A 08/01/2019   Procedure: LYSIS OF ADHESION;  Surgeon: Benjamine Sprague, DO;  Location: [facility] ORS;  Service: General;  Laterality: N/A;     FAMILY HISTORY   History reviewed. No pertinent family history.   SOCIAL HISTORY   Social History   Tobacco Use  . Smoking status: Current Every Day Smoker    Types: Cigarettes  Substance Use Topics  . Alcohol use: No  . Drug use: Not on file     MEDICATIONS   Current Medication:  Current Facility-Administered Medications:  .  0.45 % NaCl with KCl 20 mEq / L infusion, , Intravenous, Continuous, Bhutani, Manpreet S, MD, Last Rate: 75 mL/hr at  08/20/19 1400 .  chlorhexidine (PERIDEX) 0.12 % solution 15 mL, 15 mL, Mouth Rinse, BID, Lanney Gins, Faduma Cho, MD, 15 mL at 08/20/19 1501 .  [START ON 08/21/2019] Chlorhexidine Gluconate Cloth 2 % PADS 6 each, 6 each, Topical, Daily, Lanney Gins, Owen Pagnotta, MD .  folic acid (FOLVITE) tablet 1 mg, 1 mg, Oral, Daily, Sakai, Isami, DO .  heparin injection 5,000 Units, 5,000 Units, Subcutaneous, Q8H, Sakai, Isami, DO, 5,000 Units at 08/20/19 1457 .  HYDROmorphone (DILAUDID) injection 0.5 mg, 0.5 mg, Intravenous, Q4H PRN, Sakai, Isami, DO, 0.5 mg at 08/20/19 1438 .  labetalol (NORMODYNE) injection 5 mg, 5 mg, Intravenous, Q6H PRN, Lysle Pearl, Isami, DO .  lactated ringers infusion, , Intravenous, Continuous, Sakai, Isami, DO, Last Rate: 100 mL/hr at 08/20/19 0644 .  MEDLINE mouth rinse, 15 mL, Mouth Rinse, q12n4p, Ottie Glazier, MD .  multivitamin with minerals tablet 1 tablet, 1 tablet, Oral, Daily, Sakai, Isami, DO .  nicotine (NICODERM CQ - dosed in mg/24 hours) patch 21 mg, 21 mg, Transdermal, Daily PRN, Sakai, Isami, DO .  ondansetron (ZOFRAN-ODT) disintegrating tablet 4 mg, 4 mg, Oral, Q6H PRN **OR** ondansetron (ZOFRAN) injection 4 mg, 4 mg, Intravenous, Q6H PRN, Sakai, Isami, DO, 4 mg at 08/17/19 0049 .  potassium chloride SA (KLOR-CON) CR tablet 40 mEq, 40 mEq, Oral, Once, Duncan, Asajah R, RPH .  thiamine (VITAMIN B-1) tablet 100 mg, 100 mg, Oral, Daily **OR** thiamine (B-1) injection 100 mg, 100 mg, Intravenous, Daily, Sakai, Isami, DO, 100 mg at 08/20/19 3220    ALLERGIES   Patient has no known allergies.    REVIEW OF SYSTEMS    10 point ROS conducted and is negative except as per HPI and subjective findings  PHYSICAL EXAMINATION   Vital Signs: Temp:  [98 F (36.7 C)-98.5 F (36.9 C)] 98.3 F (36.8 C) (10/25 1310) Pulse Rate:  [94-128] 118 (10/25 1500) Resp:  [17-30] 25 (10/25 1500) BP: (119-144)/(74-89) 133/83 (10/25 1500) SpO2:  [90 %-100 %] 97 % (10/25 1500) FiO2 (%):  [100 %] 100 %  (10/25 1311)  GENERAL: Mild distress due to respiratory failure HEAD: Normocephalic, atraumatic.  EYES: Pupils equal, round, reactive to light.  No scleral icterus.  MOUTH: Moist mucosal membrane. NECK: Supple. No thyromegaly. No nodules. No JVD.  PULMONARY: Clear to auscultation bilaterally CARDIOVASCULAR: S1 and S2. Regular rate and rhythm. No murmurs, rubs, or gallops.  GASTROINTESTINAL: Soft, nontender, non-distended. No masses. Positive bowel sounds. No hepatosplenomegaly.  MUSCULOSKELETAL: No swelling, clubbing, or edema.  NEUROLOGIC: Mild distress due to acute illness SKIN:intact,warm,dry   PERTINENT DATA     Infusions: . 0.45 % NaCl with KCl 20 mEq / L 75 mL/hr at 08/20/19 1400  . lactated ringers 100 mL/hr at 08/20/19 0644   Scheduled Medications: . chlorhexidine  15 mL Mouth Rinse BID  . [START ON 08/21/2019] Chlorhexidine Gluconate Cloth  6 each Topical Daily  . folic acid  1 mg Oral Daily  . heparin injection (subcutaneous)  5,000 Units Subcutaneous Q8H  . mouth rinse  15 mL Mouth Rinse q12n4p  . multivitamin with minerals  1 tablet Oral Daily  . potassium chloride  40 mEq Oral Once  . thiamine  100 mg Oral Daily   Or  . thiamine  100 mg Intravenous Daily   PRN Medications: HYDROmorphone (DILAUDID) injection, labetalol, nicotine, ondansetron **OR** ondansetron (ZOFRAN) IV Hemodynamic parameters:   Intake/Output: 10/24 0701 - 10/25 0700 In: 2364.3 [I.V.:2364.3] Out: 2300 [Urine:400; Emesis/NG output:1900]  Ventilator  Settings: FiO2 (%):  [100 %] 100 %   LAB RESULTS:  Basic Metabolic Panel: Recent Labs  Lab 06/04/19 1710 08/17/19 0516 08/21/2019 0524 08/19/19 0545 08/20/19 0529  NA 136 138 141 145 151*  K 3.4* 3.5 3.3* 3.7 3.1*  CL 88* 84* 81* 94* 92*  CO2 30 33* 36* 34* 33*  GLUCOSE 145* 138* 100* 104* 96  BUN 44* 58* 78* 86* 87*  CREATININE 2.84* 3.15* 4.44* 3.97* 3.35*  CALCIUM 8.8* 8.6* 8.4* 8.2* 8.9  MG  --  2.0 2.3 2.4 2.6*  PHOS  --   6.6* 6.9* 6.0* 4.3   Liver Function Tests: Recent Labs  Lab 06/04/19 1710  AST 19  ALT 11  ALKPHOS 46  BILITOT 1.5*  PROT 7.8  ALBUMIN 4.4   Recent Labs  Lab 06/04/19 1710  LIPASE 22   No results for input(s): AMMONIA in the last 168 hours. CBC: Recent Labs  Lab 06/04/19 1551 08/17/19 0516 08/14/2019 0524 08/19/19 0545 08/20/19 0529  WBC 5.8 6.8 6.1 9.5 9.4  HGB 16.8 15.5 15.1 14.0 12.9*  HCT 48.2 45.2 45.0 43.2 40.3  MCV 96.2 95.2 98.3 100.2* 102.5*  PLT 252 222 225 226 226   Cardiac Enzymes: No results for input(s): CKTOTAL, CKMB, CKMBINDEX, TROPONINI in the last 168 hours. BNP: Invalid input(s): POCBNP CBG: Recent Labs  Lab 08/20/19 1315  GLUCAP 108*     IMAGING RESULTS:  Imaging: Dg Chest Port 1 View  Result Date: 08/20/2019 CLINICAL DATA:  Hypoxia EXAM: PORTABLE CHEST 1 VIEW COMPARISON:  None. FINDINGS: There is hazy right lower lobe airspace disease most concerning for pneumonia. There is mild hazy left lower lobe airspace disease which may reflect atelectasis versus pneumonia. There is no pleural  effusion or pneumothorax. The cardiomediastinal silhouette is normal. There is no acute osseous abnormality. There is a nasogastric tube with the tip projecting over the stomach, but the proximal port is above the esophagogastric junction. Recommend advancing nasogastric tube 10 cm. IMPRESSION: 1. Hazy right lower lobe airspace disease most concerning for pneumonia. Mild hazy left lower lobe airspace disease which may reflect atelectasis versus pneumonia. 2. Nasogastric tube with the tip projecting over the stomach, but the proximal port is above the esophagogastric junction. Recommend advancing nasogastric tube 10 cm. Electronically Signed   By: Elige Ko   On: 08/20/2019 14:23      ASSESSMENT AND PLAN    -Multidisciplinary rounds held today  Acute Hypoxic Respiratory Failure   -Due to right lower lobe pneumonia possibly secondary to strep pneumoniae    -Empirically Rocephin and Zithromax -Currently on 14 L high flow nasal cannula -Monitor for worsening respiratory status and possible need for endotracheal intubation -Wean Fio2 and PEEP as tolerated    Small bowel obstruction   -Surgery on case appreciate collaboration   -Status post ex lap with extensive lysis of adhesions   -Postop day 2   -Nasogastric tube with coffee-ground large-volume return on LIS   -We will check for heme on above -oxygen as needed -Lasix as tolerated -follow up cardiac enzymes as indicated ICU monitoring  Renal Failure-most likely due to ATN   -Nephrology on case-appreciate input  -Electrolyte repletion, half NS with K -follow chem 7 -follow UO -continue Foley Catheter-assess need daily  ID -continue IV abx as prescibed -follow up cultures  GI/Nutrition GI PROPHYLAXIS as indicated DIET-->TF's as tolerated Constipation protocol as indicated  ENDO - ICU hypoglycemic\Hyperglycemia protocol -check FSBS per protocol   ELECTROLYTES -follow labs as needed -replace as needed -pharmacy consultation   DVT/GI PRX ordered -SCDs  TRANSFUSIONS AS NEEDED MONITOR FSBS ASSESS the need for LABS as needed   Critical care provider statement:    Critical care time (minutes):  32   Critical care time was exclusive of:  Separately billable procedures and treating other patients   Critical care was necessary to treat or prevent imminent or life-threatening deterioration of the following conditions:   Acute hypoxemic respiratory failure, small bowel obstruction status post exploratory laparotomy with extensive lysis of adhesions acute on chronic renal failure stage III, pneumonia of the right lower lobe, multiple comorbid conditions   Critical care was time spent personally by me on the following activities:  Development of treatment plan with patient or surrogate, discussions with consultants, evaluation of patient's response to treatment, examination of  patient, obtaining history from patient or surrogate, ordering and performing treatments and interventions, ordering and review of laboratory studies and re-evaluation of patient's condition.  I assumed direction of critical care for this patient from another provider in my specialty: no    This document was prepared using Dragon voice recognition software and may include unintentional dictation errors.    Vida Rigger, M.D.  Division of Pulmonary & Critical Care Medicine  [facility]

## 2019-08-20 NOTE — Consult Note (Signed)
PHARMACY CONSULT NOTE - FOLLOW UP  Pharmacy Consult for Electrolyte Monitoring and Replacement   Recent Labs: Potassium (mmol/L)  Date Value  08/20/2019 3.1 (L)   Magnesium (mg/dL)  Date Value  08/20/2019 2.6 (H)   Calcium (mg/dL)  Date Value  08/20/2019 8.9   Albumin (g/dL)  Date Value  Sep 13, 2019 4.4   Phosphorus (mg/dL)  Date Value  08/20/2019 4.3   Sodium (mmol/L)  Date Value  08/20/2019 151 (H)     Assessment: Phos 4.3 Mg 2.6 K 3.1 NaCl + KCL 20 mEq IV infusion   Goal of Therapy:  Electrolytes WNL   Plan:  Will order KCL PO 40 mEq x1 dose. Will follow electrolytes with AM labs.   Rowland Lathe ,PharmD Clinical Pharmacist 08/20/2019 4:01 PM

## 2019-08-20 NOTE — Progress Notes (Signed)
Keith Vincent  MRN: [id number]  DOB/AGE: April 06, 1951 68 y.o.  Primary Care Physician:Patient, No Pcp Per  Admit date: 07/31/2019  Chief Complaint:  Chief Complaint  Patient presents with  . Nausea    S-Pt presented on  08/01/2019 with  Chief Complaint  Patient presents with  . Nausea  .    Pt says " I am still thirsty"   Meds   . chlorhexidine  15 mL Mouth Rinse BID  . [START ON 08/21/2019] Chlorhexidine Gluconate Cloth  6 each Topical Daily  . folic acid  1 mg Oral Daily  . heparin injection (subcutaneous)  5,000 Units Subcutaneous Q8H  . mouth rinse  15 mL Mouth Rinse q12n4p  . multivitamin with minerals  1 tablet Oral Daily  . potassium chloride  40 mEq Oral Once  . thiamine  100 mg Oral Daily   Or  . thiamine  100 mg Intravenous Daily         ZJQ:BHALP from the symptoms mentioned above,there are no other symptoms referable to all systems reviewed.  Physical Exam: Vital signs in last 24 hours: Temp:  [98 F (36.7 C)-98.5 F (36.9 C)] 98.3 F (36.8 C) (10/25 1310) Pulse Rate:  [94-128] 118 (10/25 1500) Resp:  [17-30] 25 (10/25 1500) BP: (119-144)/(74-89) 133/83 (10/25 1500) SpO2:  [90 %-100 %] 97 % (10/25 1500) FiO2 (%):  [100 %] 100 % (10/25 1311) Weight change:  Last BM Date: 08/12/19  Intake/Output from previous day: 10/24 0701 - 10/25 0700 In: 2364.3 [I.V.:2364.3] Out: 2300 [Urine:400; Emesis/NG output:1900] Total I/O In: 274.9 [I.V.:274.9] Out: 1100 [Urine:300; Emesis/NG output:800]   Physical Exam: General- pt is awake,alert, oriented to time place and person Resp- No acute REsp distress, NO Rhonchi CVS- S1S2 regular in rate and rhythm GIT- BS reduced,, minimal tenderness over incision site  EXT- NO LE Edema, NO Cyanosis   Lab Results: CBC Recent Labs    08/19/19 0545 08/20/19 0529  WBC 9.5 9.4  HGB 14.0 12.9*  HCT 43.2 40.3  PLT 226 226    BMET Recent Labs    08/19/19 0545 08/20/19 0529  NA 145 151*  K 3.7 3.1*  CL  94* 92*  CO2 34* 33*  GLUCOSE 104* 96  BUN 86* 87*  CREATININE 3.97* 3.35*  CALCIUM 8.2* 8.9   Creat trend 2020    2.8==> 4.4==> 3.97=> 3.35      MICRO Recent Results (from the past 240 hour(s))  SARS Coronavirus 2 by RT PCR (hospital order, performed in [facility] hospital lab) Nasopharyngeal Nasopharyngeal Swab     Status: None   Collection Time: 07/31/2019  6:54 PM   Specimen: Nasopharyngeal Swab  Result Value Ref Range Status   SARS Coronavirus 2 NEGATIVE NEGATIVE Final    Comment: (NOTE) If result is NEGATIVE SARS-CoV-2 target nucleic acids are NOT DETECTED. The SARS-CoV-2 RNA is generally detectable in upper and lower  respiratory specimens during the acute phase of infection. The lowest  concentration of SARS-CoV-2 viral copies this assay can detect is 250  copies / mL. A negative result does not preclude SARS-CoV-2 infection  and should not be used as the sole basis for treatment or other  patient management decisions.  A negative result may occur with  improper specimen collection / handling, submission of specimen other  than nasopharyngeal swab, presence of viral mutation(s) within the  areas targeted by this assay, and inadequate number of viral copies  (<250 copies / mL). A negative result must be  combined with clinical  observations, patient history, and epidemiological information. If result is POSITIVE SARS-CoV-2 target nucleic acids are DETECTED. The SARS-CoV-2 RNA is generally detectable in upper and lower  respiratory specimens dur ing the acute phase of infection.  Positive  results are indicative of active infection with SARS-CoV-2.  Clinical  correlation with patient history and other diagnostic information is  necessary to determine patient infection status.  Positive results do  not rule out bacterial infection or co-infection with other viruses. If result is PRESUMPTIVE POSTIVE SARS-CoV-2 nucleic acids MAY BE PRESENT.   A presumptive positive  result was obtained on the submitted specimen  and confirmed on repeat testing.  While 2019 novel coronavirus  (SARS-CoV-2) nucleic acids may be present in the submitted sample  additional confirmatory testing may be necessary for epidemiological  and / or clinical management purposes  to differentiate between  SARS-CoV-2 and other Sarbecovirus currently known to infect humans.  If clinically indicated additional testing with an alternate test  methodology [id number]) is advised. The SARS-CoV-2 RNA is generally  detectable in upper and lower respiratory sp ecimens during the acute  phase of infection. The expected result is Negative. Fact Sheet for Patients:  StrictlyIdeas.no Fact Sheet for Healthcare Providers: BankingDealers.co.za This test is not yet approved or cleared by the [place] FDA and has been authorized for detection and/or diagnosis of SARS-CoV-2 by FDA under an Emergency Use Authorization (EUA).  This EUA will remain in effect (meaning this test can be used) for the duration of the COVID-19 declaration under Section 564(b)(1) of the Act, 21 U.S.C. section 360bbb-3(b)(1), unless the authorization is terminated or revoked sooner. Performed at [facility] [address] [place], [place] [address]   MRSA PCR Screening     Status: None   Collection Time: 08/20/19  1:36 PM   Specimen: Nasopharyngeal  Result Value Ref Range Status   MRSA by PCR NEGATIVE NEGATIVE Final    Comment:        The GeneXpert MRSA Assay (FDA approved for NASAL specimens only), is one component of a comprehensive MRSA colonization surveillance program. It is not intended to diagnose MRSA infection nor to guide or monitor treatment for MRSA infections. Performed at [facility], [address] [place], [place] [address]       Lab Results  Component Value Date   CALCIUM 8.9 08/20/2019   PHOS 4.3 08/20/2019                Impression:  Keith Vincent is a 68 y.o.  African-American male with alcoholic hepatic cirrhosis, GERD/peptic ulcer disease, , who was admitted to [facility] on 08/20/2019 for SBO (small bowel obstruction),AKI (acute kidney injury) .  Patient underwent  bowel surgery yesterday.   1)Renal  AKI secondary to ATN                AKI secondary to hypovolemia                No sign of fluid overload/hyperkalemia/acidosis                AKI little better, creatinine is trending down  2) hypotension     Blood pressure on lower side but stable, no need for vasopressors   3)Anemia   4) secondary hyperparathyroidism CKD Mineral-Bone Disorder  Secondary Hyperparathyroidism present Phosphorus high Will not start binders as patient is postop  5) small bowel obstruction Patient was admitted with small bowel obstruction. Patient is now  status post exploratory laparotomy with lysis of adhesions  6) hypokalemia  Being replete  7) Alkalosis Secondary to upper GI losses Co2 trending down  8) hypernatremia Will increase free water intake    Plan:   We we will start patient on half-normal saline with 20 mEq of KCl that should help with hypernatremia as well as with hypokalemia   Manpreet s Bhutani 08/20/2019, 4:27 PM

## 2019-08-20 NOTE — Progress Notes (Signed)
Patient's pulse oximetry was at 81% on 4 liters/minute on his continuous pulse ox. Writer rechecked patient's oxygen and was at 81%, increased patients oxygen from 4L/min to 6L/min and sustained at 81% and pulse was at 125. Writer notified Dr. Windell Moment and called respiratory. Respiratory and rapid response nurse came to assess patient. Patient remained alert, oriented and asymptomatic on 15L/min on a non-rebreather mask at 84-88% oxygen saturation. Rapid response nurse moved patient to ICU bed 19 with Dr. Peyton Najjar at bedside. Writer gave hand off report to ICU nurse.

## 2019-08-20 NOTE — Progress Notes (Signed)
[facility] Day(s): 4.   Post op day(s): 2 Days Post-Op.   Interval History: Patient seen and examined.  Patient found with O2 desaturation.  He denies any shortness of breath.  He denies any chest pain.  He denies any abdominal pain.  He denies lower extremity pain or swelling.  He denies nausea or vomiting.  Patient with NGT in place.  Vital signs in last 24 hours: [min-max] current  Temp:  [98 F (36.7 C)-98.5 F (36.9 C)] 98.3 F (36.8 C) (10/25 1310) Pulse Rate:  [94-116] 115 (10/25 1400) Resp:  [17-27] 27 (10/25 1400) BP: (119-144)/(74-89) 124/74 (10/25 1400) SpO2:  [90 %-97 %] 96 % (10/25 1400) FiO2 (%):  [100 %] 100 % (10/25 1311)     Height: 5\' 9"  (175.3 cm) Weight: 59 kg BMI (Calculated): 19.2   Physical Exam:  Constitutional: alert, cooperative and no distress  Respiratory: breathing non-labored at rest  Cardiovascular: regular rate and sinus rhythm  Gastrointestinal: soft, non-tender, and mild-distended  Labs:  CBC Latest Ref Rng & Units 08/20/2019 08/19/2019 08/25/2019  WBC 4.0 - 10.5 K/uL 9.4 9.5 6.1  Hemoglobin 13.0 - 17.0 g/dL 12.9(L) 14.0 15.1  Hematocrit 39.0 - 52.0 % 40.3 43.2 45.0  Platelets 150 - 400 K/uL 226 226 225   CMP Latest Ref Rng & Units 08/20/2019 08/19/2019 08/03/2019  Glucose 70 - 99 mg/dL 96 104(H) 100(H)  BUN 8 - 23 mg/dL 87(H) 86(H) 78(H)  Creatinine 0.61 - 1.24 mg/dL 3.35(H) 3.97(H) 4.44(H)  Sodium 135 - 145 mmol/L 151(H) 145 141  Potassium 3.5 - 5.1 mmol/L 3.1(L) 3.7 3.3(L)  Chloride 98 - 111 mmol/L 92(L) 94(L) 81(L)  CO2 22 - 32 mmol/L 33(H) 34(H) 36(H)  Calcium 8.9 - 10.3 mg/dL 8.9 8.2(L) 8.4(L)  Total Protein 6.5 - 8.1 g/dL - - -  Total Bilirubin 0.3 - 1.2 mg/dL - - -  Alkaline Phos 38 - 126 U/L - - -  AST 15 - 41 U/L - - -  ALT 0 - 44 U/L - - -    Imaging studies: New chest x-ray with an opacity in the right lower lobe.  This is concerning for pneumonia.  I personally evaluated the  images.   Assessment/Plan:  68 y.o. male with small bowel obstruction 2 Day Post-Op s/p exploratory laparotomy with extensive lysis of adhesions, complicated by pertinent comorbidities including acute kidney injury secondary to acute tubular necrosis and hypovolemia GERD and alcoholism. Patient today with O2 saturation in the 80s.  This did not improve with increasing O2 supplement.  Otherwise patient without shortness of breath or chest pain.  There is no lower extremity edema or swelling.  There is no upper extremity edema or swelling.  There is no pain on the lower extremities on passive motion.  I have low suspicious of PE at this moment.  Also patient without chest pain or shortness of breath.  No suspicious of cardiac event.  Chest x-ray shows an opacity in the right lower lobe that can explain patient desaturation.  I agree with transfer the patient to stepdown unit.  I appreciate critical care evaluation and recommendations.  From the surgery standpoint patient will need to continue with NGT to low intermittent suction.  He is still not passing gas.  There is no bowel movement.  We will continue with IV hydration.  Renal function has been slowly improving with decreased creatinine.  Patient was out of bed yesterday.  We will continue with DVT prophylaxis.  Encourage patient to perform incentive spirometer.  Gae Gallop, MD

## 2019-08-21 ENCOUNTER — Inpatient Hospital Stay: Payer: Medicare Other

## 2019-08-21 DIAGNOSIS — J9601 Acute respiratory failure with hypoxia: Secondary | ICD-10-CM | POA: Diagnosis not present

## 2019-08-21 DIAGNOSIS — K56609 Unspecified intestinal obstruction, unspecified as to partial versus complete obstruction: Secondary | ICD-10-CM

## 2019-08-21 LAB — CBC
HCT: 36.1 % — ABNORMAL LOW (ref 39.0–52.0)
Hemoglobin: 11.2 g/dL — ABNORMAL LOW (ref 13.0–17.0)
MCH: 32.6 pg (ref 26.0–34.0)
MCHC: 31 g/dL (ref 30.0–36.0)
MCV: 104.9 fL — ABNORMAL HIGH (ref 80.0–100.0)
Platelets: 218 10*3/uL (ref 150–400)
RBC: 3.44 MIL/uL — ABNORMAL LOW (ref 4.22–5.81)
RDW: 12.3 % (ref 11.5–15.5)
WBC: 13.8 10*3/uL — ABNORMAL HIGH (ref 4.0–10.5)
nRBC: 0.1 % (ref 0.0–0.2)

## 2019-08-21 LAB — COMPREHENSIVE METABOLIC PANEL
ALT: 129 U/L — ABNORMAL HIGH (ref 0–44)
AST: 292 U/L — ABNORMAL HIGH (ref 15–41)
Albumin: 2.4 g/dL — ABNORMAL LOW (ref 3.5–5.0)
Alkaline Phosphatase: 47 U/L (ref 38–126)
Anion gap: 15 (ref 5–15)
BUN: 98 mg/dL — ABNORMAL HIGH (ref 8–23)
CO2: 45 mmol/L — ABNORMAL HIGH (ref 22–32)
Calcium: 8.7 mg/dL — ABNORMAL LOW (ref 8.9–10.3)
Chloride: 96 mmol/L — ABNORMAL LOW (ref 98–111)
Creatinine, Ser: 2.84 mg/dL — ABNORMAL HIGH (ref 0.61–1.24)
GFR calc Af Amer: 26 mL/min — ABNORMAL LOW (ref >60)
GFR calc non Af Amer: 22 mL/min — ABNORMAL LOW (ref >60)
Glucose, Bld: 170 mg/dL — ABNORMAL HIGH (ref 70–99)
Potassium: 3.4 mmol/L — ABNORMAL LOW (ref 3.5–5.1)
Sodium: 156 mmol/L — ABNORMAL HIGH (ref 135–145)
Total Bilirubin: 4.2 mg/dL — ABNORMAL HIGH (ref 0.3–1.2)
Total Protein: 6.1 g/dL — ABNORMAL LOW (ref 6.5–8.1)

## 2019-08-21 LAB — BLOOD GAS, ARTERIAL
Acid-Base Excess: 26.1 mmol/L — ABNORMAL HIGH (ref 0.0–2.0)
Bicarbonate: 53.5 mmol/L — ABNORMAL HIGH (ref 20.0–28.0)
FIO2: 100
MECHVT: 500 mL
O2 Saturation: 99.9 %
PEEP: 10 cmH2O
Patient temperature: 37
RATE: 20 resp/min
pCO2 arterial: 64 mmHg — ABNORMAL HIGH (ref 32.0–48.0)
pH, Arterial: 7.53 — ABNORMAL HIGH (ref 7.350–7.450)
pO2, Arterial: 286 mmHg — ABNORMAL HIGH (ref 83.0–108.0)

## 2019-08-21 LAB — BASIC METABOLIC PANEL
Anion gap: 17 — ABNORMAL HIGH (ref 5–15)
BUN: 98 mg/dL — ABNORMAL HIGH (ref 8–23)
CO2: 43 mmol/L — ABNORMAL HIGH (ref 22–32)
Calcium: 8.9 mg/dL (ref 8.9–10.3)
Chloride: 94 mmol/L — ABNORMAL LOW (ref 98–111)
Creatinine, Ser: 2.75 mg/dL — ABNORMAL HIGH (ref 0.61–1.24)
GFR calc Af Amer: 27 mL/min — ABNORMAL LOW (ref >60)
GFR calc non Af Amer: 23 mL/min — ABNORMAL LOW (ref >60)
Glucose, Bld: 181 mg/dL — ABNORMAL HIGH (ref 70–99)
Potassium: 3 mmol/L — ABNORMAL LOW (ref 3.5–5.1)
Sodium: 154 mmol/L — ABNORMAL HIGH (ref 135–145)

## 2019-08-21 LAB — MAGNESIUM: Magnesium: 2.8 mg/dL — ABNORMAL HIGH (ref 1.7–2.4)

## 2019-08-21 LAB — PHOSPHORUS: Phosphorus: 2.2 mg/dL — ABNORMAL LOW (ref 2.5–4.6)

## 2019-08-21 MED ORDER — METOPROLOL TARTRATE 5 MG/5ML IV SOLN
2.5000 mg | Freq: Four times a day (QID) | INTRAVENOUS | Status: DC | PRN
  Filled 2019-08-21: qty 5

## 2019-08-21 MED ORDER — FENTANYL CITRATE (PF) 100 MCG/2ML IJ SOLN
INTRAMUSCULAR | Status: AC
  Administered 2019-08-21: 18:00:00 100 ug
  Filled 2019-08-21: qty 4

## 2019-08-21 MED ORDER — FENTANYL BOLUS VIA INFUSION
50.0000 ug | INTRAVENOUS | Status: DC | PRN
  Filled 2019-08-21: qty 100

## 2019-08-21 MED ORDER — MIDAZOLAM HCL 2 MG/2ML IJ SOLN
INTRAMUSCULAR | Status: AC
  Administered 2019-08-21: 18:00:00 2 mg
  Filled 2019-08-21: qty 2

## 2019-08-21 MED ORDER — STERILE WATER FOR INJECTION IJ SOLN
INTRAMUSCULAR | Status: AC
  Filled 2019-08-21: qty 10

## 2019-08-21 MED ORDER — FENTANYL 2500MCG IN NS 250ML (10MCG/ML) PREMIX INFUSION
0.0000 ug/h | INTRAVENOUS | Status: DC
  Administered 2019-08-21: 19:00:00 25 ug/h via INTRAVENOUS
  Administered 2019-08-22: 04:00:00 200 ug/h via INTRAVENOUS
  Administered 2019-08-22: 12:00:00 300 ug/h via INTRAVENOUS
  Administered 2019-08-22 – 2019-08-23 (×2): 200 ug/h via INTRAVENOUS
  Filled 2019-08-21 (×5): qty 250

## 2019-08-21 MED ORDER — METHYLPREDNISOLONE SODIUM SUCC 40 MG IJ SOLR
40.0000 mg | Freq: Two times a day (BID) | INTRAMUSCULAR | Status: DC
  Administered 2019-08-21 – 2019-08-23 (×4): 40 mg via INTRAVENOUS
  Filled 2019-08-21 (×4): qty 1

## 2019-08-21 MED ORDER — SODIUM CHLORIDE 0.9 % IV SOLN
2.0000 g | Freq: Every day | INTRAVENOUS | Status: DC
  Administered 2019-08-21 – 2019-08-22 (×2): 2 g via INTRAVENOUS
  Filled 2019-08-21: qty 2
  Filled 2019-08-21: qty 20
  Filled 2019-08-21: qty 2

## 2019-08-21 MED ORDER — SODIUM CHLORIDE 0.9 % IV SOLN
500.0000 mg | Freq: Every day | INTRAVENOUS | Status: DC
  Administered 2019-08-21 – 2019-08-22 (×2): 500 mg via INTRAVENOUS
  Filled 2019-08-21 (×4): qty 500

## 2019-08-21 MED ORDER — POTASSIUM PHOSPHATES 15 MMOLE/5ML IV SOLN
20.0000 mmol | Freq: Once | INTRAVENOUS | Status: AC
  Administered 2019-08-21: 20:00:00 20 mmol via INTRAVENOUS
  Filled 2019-08-21: qty 6.67

## 2019-08-21 MED ORDER — POTASSIUM CL IN DEXTROSE 5% 20 MEQ/L IV SOLN
20.0000 meq | INTRAVENOUS | Status: DC
  Administered 2019-08-21 – 2019-08-22 (×2): 20 meq via INTRAVENOUS
  Filled 2019-08-21 (×2): qty 1000

## 2019-08-21 MED ORDER — BUDESONIDE 0.5 MG/2ML IN SUSP
0.5000 mg | Freq: Two times a day (BID) | RESPIRATORY_TRACT | Status: DC
  Administered 2019-08-21 – 2019-08-23 (×4): 0.5 mg via RESPIRATORY_TRACT
  Filled 2019-08-21 (×4): qty 2

## 2019-08-21 MED ORDER — FAMOTIDINE IN NACL 20-0.9 MG/50ML-% IV SOLN
20.0000 mg | INTRAVENOUS | Status: DC
  Administered 2019-08-21: 22:00:00 20 mg via INTRAVENOUS
  Filled 2019-08-21: qty 50

## 2019-08-21 MED ORDER — IPRATROPIUM-ALBUTEROL 0.5-2.5 (3) MG/3ML IN SOLN
3.0000 mL | RESPIRATORY_TRACT | Status: DC
  Administered 2019-08-21 – 2019-08-23 (×11): 3 mL via RESPIRATORY_TRACT
  Filled 2019-08-21 (×11): qty 3

## 2019-08-21 MED ORDER — VECURONIUM BROMIDE 10 MG IV SOLR
INTRAVENOUS | Status: AC
  Administered 2019-08-21: 18:00:00 10 mg via INTRAVENOUS
  Filled 2019-08-21: qty 10

## 2019-08-21 MED ORDER — PHENYLEPHRINE HCL-NACL 10-0.9 MG/250ML-% IV SOLN
0.0000 ug/min | INTRAVENOUS | Status: DC
  Filled 2019-08-21: qty 250

## 2019-08-21 NOTE — Procedures (Signed)
Endotracheal Intubation: Patient required placement of an artificial airway secondary to Respiratory Failure  Consent: Emergent.   Hand washing performed prior to starting the procedure.   Medications administered for sedation prior to procedure:  Midazolam 4 mg IV,  Vecuronium 10 mg IV, Fentanyl 200 mcg IV.    A time out procedure was called and correct patient, name, & ID confirmed. Needed supplies and equipment were assembled and checked to include ETT, 10 ml syringe, Glidescope, Mac and Miller blades, suction, oxygen and bag mask valve, end tidal CO2 monitor.   Patient was positioned to align the mouth and pharynx to facilitate visualization of the glottis.   Heart rate, SpO2 and blood pressure was continuously monitored during the procedure. Pre-oxygenation was conducted prior to intubation and endotracheal tube was placed through the vocal cords into the trachea.     The artificial airway was placed under direct visualization via glidescope route using a 8.0  ETT on the first attempt.  ETT was secured at 23 cm mark.  Placement was confirmed by auscuitation of lungs with good breath sounds bilaterally and no stomach sounds.  Condensation was noted on endotracheal tube.   Pulse ox 98%.  CO2 detector in place with appropriate color change.   Complications: None .   Operator: Hadja Harral.   Chest radiograph ordered and pending.     Corrin Parker, M.D.  [facility]  Medical Director [place] Director [facility] Cardio-Pulmonary Department

## 2019-08-21 NOTE — Progress Notes (Addendum)
Name: Keith Vincent MRN: [id number] DOB: 12-09-1950     LOS: 5  Patient description:  This a pleasant 68 year old male with a history of GERD, chronic alcoholism, lifelong smoking, chronic anemia electrolyte imbalances, small bowel obstruction currently postop day 2 status post ex lap with extensive lysis of adhesions, development of AKI and acute hypoxemic respiratory failure.  Critical care consultation placed by surgery team for management of acute hypoxemic respiratory failure and multiple comorbid conditions.   CC follow up SBO and Sepsis  HPI Remains critically ill Increased WOB Sepsis Hypoxia  FiO2 (%):  [80 %] 80 %  CBC    Component Value Date/Time   WBC 13.8 (H) 08/21/2019 0519   RBC 3.44 (L) 08/21/2019 0519   HGB 11.2 (L) 08/21/2019 0519   HCT 36.1 (L) 08/21/2019 0519   PLT 218 08/21/2019 0519   MCV 104.9 (H) 08/21/2019 0519   MCH 32.6 08/21/2019 0519   MCHC 31.0 08/21/2019 0519   RDW 12.3 08/21/2019 0519   BMP Latest Ref Rng & Units 08/21/2019 08/20/2019 08/19/2019  Glucose 70 - 99 mg/dL 671(I) 96 458(K)  BUN 8 - 23 mg/dL 99(I) 33(A) 25(K)  Creatinine 0.61 - 1.24 mg/dL 5.39(J) 6.73(A) 1.93(X)  Sodium 135 - 145 mmol/L 154(H) 151(H) 145  Potassium 3.5 - 5.1 mmol/L 3.0(L) 3.1(L) 3.7  Chloride 98 - 111 mmol/L 94(L) 92(L) 94(L)  CO2 22 - 32 mmol/L 43(H) 33(H) 34(H)  Calcium 8.9 - 10.3 mg/dL 8.9 8.9 9.0(W)      MEDICATIONS   Current Medication:  Current Facility-Administered Medications:  .  azithromycin (ZITHROMAX) 500 mg in sodium chloride 0.9 % 250 mL IVPB, 500 mg, Intravenous, q1800, Shayann Garbutt, MD .  budesonide (PULMICORT) nebulizer solution 0.5 mg, 0.5 mg, Nebulization, BID, Demetrus Pavao, MD .  cefTRIAXone (ROCEPHIN) 2 g in sodium chloride 0.9 % 100 mL IVPB, 2 g, Intravenous, q1800, Joslin Doell, MD, Last Rate: 200 mL/hr at 08/21/19 1658, 2 g at 08/21/19 1658 .  chlorhexidine (PERIDEX) 0.12 % solution 15 mL, 15 mL, Mouth Rinse, BID, Karna Christmas,  Fuad, MD, 15 mL at 08/21/19 1219 .  Chlorhexidine Gluconate Cloth 2 % PADS 6 each, 6 each, Topical, Daily, Vida Rigger, MD, 6 each at 08/21/19 1136 .  dextrose 5 % with KCl 20 mEq / L  infusion, 20 mEq, Intravenous, Continuous, Lowella Bandy, RPH, Last Rate: 75 mL/hr at 08/21/19 1402, 20 mEq at 08/21/19 1402 .  folic acid (FOLVITE) tablet 1 mg, 1 mg, Oral, Daily, Sakai, Isami, DO .  heparin injection 5,000 Units, 5,000 Units, Subcutaneous, Q8H, Sakai, Isami, DO, 5,000 Units at 08/21/19 0620 .  HYDROmorphone (DILAUDID) injection 0.5 mg, 0.5 mg, Intravenous, Q4H PRN, Sakai, Isami, DO, 0.5 mg at 08/21/19 1705 .  ipratropium-albuterol (DUONEB) 0.5-2.5 (3) MG/3ML nebulizer solution 3 mL, 3 mL, Nebulization, Q4H, Korrine Sicard, MD, 3 mL at 08/21/19 1505 .  labetalol (NORMODYNE) injection 5 mg, 5 mg, Intravenous, Q6H PRN, Sakai, Isami, DO .  MEDLINE mouth rinse, 15 mL, Mouth Rinse, q12n4p, Karna Christmas, Fuad, MD, 15 mL at 08/21/19 1659 .  methylPREDNISolone sodium succinate (SOLU-MEDROL) 40 mg/mL injection 40 mg, 40 mg, Intravenous, Q12H, Weiland Tomich, MD, 40 mg at 08/21/19 1702 .  multivitamin with minerals tablet 1 tablet, 1 tablet, Oral, Daily, Sakai, Isami, DO .  nicotine (NICODERM CQ - dosed in mg/24 hours) patch 21 mg, 21 mg, Transdermal, Daily PRN, Sakai, Isami, DO .  ondansetron (ZOFRAN-ODT) disintegrating tablet 4 mg, 4 mg, Oral, Q6H PRN **OR** ondansetron (  ZOFRAN) injection 4 mg, 4 mg, Intravenous, Q6H PRN, Sakai, Isami, DO, 4 mg at 08/17/19 0049 .  potassium PHOSPHATE 20 mmol in dextrose 5 % 500 mL infusion, 20 mmol, Intravenous, Once, Lowella BandyGrubb, Rodney D, RPH .  thiamine (VITAMIN B-1) tablet 100 mg, 100 mg, Oral, Daily **OR** thiamine (B-1) injection 100 mg, 100 mg, Intravenous, Daily, Sakai, Isami, DO, 100 mg at 08/20/19 [id number]    ALLERGIES   Patient has no known allergies.   REVIEW OF SYSTEMS  PATIENT IS UNABLE TO PROVIDE COMPLETE REVIEW OF SYSTEM S DUE TO SEVERE CRITICAL ILLNESS AND  ENCEPHALOPATHY   PHYSICAL EXAMINATION   Vital Signs: Temp:  [97.7 F (36.5 C)-98.6 F (37 C)] 98.6 F (37 C) (10/26 1700) Pulse Rate:  [109-132] 132 (10/26 1600) Resp:  [18-32] 24 (10/26 1700) BP: (94-136)/(68-78) 94/68 (10/26 1700) SpO2:  [86 %-100 %] 96 % (10/26 1600) FiO2 (%):  [80 %] 80 % (10/26 1440)  PHYSICAL EXAMINATION:  GENERAL:critically ill appearing, +resp distress HEAD: Normocephalic, atraumatic.  EYES: Pupils equal, round, reactive to light.  No scleral icterus.  MOUTH: Moist mucosal membrane. NECK: Supple. No thyromegaly. No nodules. No JVD.  PULMONARY: +rhonchi, +wheezing CARDIOVASCULAR: S1 and S2. Regular rate and rhythm. No murmurs, rubs, or gallops.  GASTROINTESTINAL: Soft, tender, +distended. No bowel sounds.  MUSCULOSKELETAL: No swelling, clubbing, or edema.  NEUROLOGIC: lethargic SKIN:intact,warm,dry     PERTINENT DATA     Infusions: . azithromycin    . cefTRIAXone (ROCEPHIN)  IV 2 g (08/21/19 1658)  . dextrose 5 % with KCl 20 mEq / L 20 mEq (08/21/19 1402)  . potassium PHOSPHATE IVPB (in mmol)     Scheduled Medications: . budesonide (PULMICORT) nebulizer solution  0.5 mg Nebulization BID  . chlorhexidine  15 mL Mouth Rinse BID  . Chlorhexidine Gluconate Cloth  6 each Topical Daily  . folic acid  1 mg Oral Daily  . heparin injection (subcutaneous)  5,000 Units Subcutaneous Q8H  . ipratropium-albuterol  3 mL Nebulization Q4H  . mouth rinse  15 mL Mouth Rinse q12n4p  . methylPREDNISolone (SOLU-MEDROL) injection  40 mg Intravenous Q12H  . multivitamin with minerals  1 tablet Oral Daily  . thiamine  100 mg Oral Daily   Or  . thiamine  100 mg Intravenous Daily   PRN Medications: HYDROmorphone (DILAUDID) injection, labetalol, nicotine, ondansetron **OR** ondansetron (ZOFRAN) IV Hemodynamic parameters:   Intake/Output: 10/25 0701 - 10/26 0700 In: 1236.4 [I.V.:1216.4; NG/GT:20] Out: 2185 [Urine:900; Emesis/NG output:1285]  Ventilator   Settings: FiO2 (%):  [80 %] 80 %        ASSESSMENT AND PLAN    Severe ACUTE Hypoxic and Hypercapnic Respiratory Failure SBO with abd distention and increased WOB with atalectasis High flow [place] Wean fio2 as tolerated High risk for intubation   SBO S/p ex lap NGT on LIS Lasix as tolerated  ACUTE KIDNEY INJURY/Renal Failure -follow chem 7 -follow UO -continue Foley Catheter-assess need -Avoid nephrotoxic agents -Renal ultrasound if indicated    DVT/GI PRX ordered TRANSFUSIONS AS NEEDED MONITOR FSBS ASSESS the need for LABS as needed   ELECTROLYTES -follow labs as needed -replace as needed -pharmacy consultation and following   SEVERE ALCOHOL WITHDRAWAL -Therapy with Thiamine and MVI -CIWA Protocol -Precedex as needed -High risk for intubation  -high risk for aspiration   Critical Care Time devoted to patient care services described in this note is 32 minutes.   Overall, patient is critically ill, prognosis is guarded.  Patient with Multiorgan failure and  at high risk for cardiac arrest and death.    Corrin Parker, M.D.  [facility]  Medical Director [place] Director [facility] Cardio-Pulmonary Department

## 2019-08-21 NOTE — Progress Notes (Signed)
Subjective:  CC: Keith Vincent is a 68 y.o. male  Hospital stay day 5, 3 Days Post-Op ex-lap, LOA for SBO  HPI: Pt agitated and unable to answer questions.    ROS:  Not completed due to above  Objective:   Temp:  [97.7 F (36.5 C)-98.6 F (37 C)] 97.7 F (36.5 C) (10/26 0803) Pulse Rate:  [109-131] 131 (10/26 1505) Resp:  [18-26] 23 (10/26 1505) BP: (97-136)/(69-82) 97/76 (10/26 1505) SpO2:  [86 %-100 %] 95 % (10/26 1505) FiO2 (%):  [80 %] 80 % (10/26 1440)     Height: 5\' 9"  (175.3 cm) Weight: 59 kg BMI (Calculated): 19.2   Intake/Output this shift:   Intake/Output Summary (Last 24 hours) at 08/21/2019 1543 Last data filed at 08/21/2019 1502 Gross per 24 hour  Intake 961.55 ml  Output 1835 ml  Net -873.45 ml  NG with dark bilious output  Constitutional :  Agitated, unable to answer questions.  Respiratory:  clear to auscultation bilaterally  Cardiovascular:  regular rate and rhythm  Gastrointestinal: soft, non-tender; bowel sounds normal; no masses,  no organomegaly.  Distention improved since previous exam. Staples c/d/i  Skin: Cool and moist.   Psychiatric: Normal affect, non-agitated, not confused       LABS:  CMP Latest Ref Rng & Units 08/21/2019 08/20/2019 08/19/2019  Glucose 70 - 99 mg/dL 181(H) 96 104(H)  BUN 8 - 23 mg/dL 98(H) 87(H) 86(H)  Creatinine 0.61 - 1.24 mg/dL 2.75(H) 3.35(H) 3.97(H)  Sodium 135 - 145 mmol/L 154(H) 151(H) 145  Potassium 3.5 - 5.1 mmol/L 3.0(L) 3.1(L) 3.7  Chloride 98 - 111 mmol/L 94(L) 92(L) 94(L)  CO2 22 - 32 mmol/L 43(H) 33(H) 34(H)  Calcium 8.9 - 10.3 mg/dL 8.9 8.9 8.2(L)  Total Protein 6.5 - 8.1 g/dL - - -  Total Bilirubin 0.3 - 1.2 mg/dL - - -  Alkaline Phos 38 - 126 U/L - - -  AST 15 - 41 U/L - - -  ALT 0 - 44 U/L - - -   CBC Latest Ref Rng & Units 08/21/2019 08/20/2019 08/20/2019  WBC 4.0 - 10.5 K/uL 13.8(H) - 9.4  Hemoglobin 13.0 - 17.0 g/dL 11.2(L) 11.8(L) 12.9(L)  Hematocrit 39.0 - 52.0 % 36.1(L) 37.4(L) 40.3   Platelets 150 - 400 K/uL 218 - 226    RADS: n/a Assessment:   S/p ex-lap, LOA for SBO.  Now likely with ileus.  Will continue with NG decompression, until flatus or BM.  Abdominal exam reassuring at this time, but NG output still significant.  PNA, AKI, electrolytes per ICU team.

## 2019-08-21 NOTE — Progress Notes (Signed)
[facility] [place], [place] 08/21/19  Subjective:   Patient remains critically ill NG tube in place NG aspirate about 1300 cc Complains of abdominal pain Managed with IV Dilaudid 10/25 0701 - 10/26 0700 In: 1236.4 [I.V.:1216.4; NG/GT:20] Out: 2185 [Urine:900; Emesis/NG output:1285]   Objective:  Vital signs in last 24 hours:  Temp:  [97.7 F (36.5 C)-98.6 F (37 C)] 97.7 F (36.5 C) (10/26 0803) Pulse Rate:  [97-128] 113 (10/26 0400) Resp:  [17-30] 25 (10/26 0400) BP: (103-144)/(69-89) 108/69 (10/26 0400) SpO2:  [90 %-100 %] 96 % (10/26 0400) FiO2 (%):  [80 %-100 %] 80 % (10/25 1528)  Weight change:  Filed Weights   08/17/2019 1546 Aug 12, 2019 1330  Weight: 59 kg 59 kg    Intake/Output:    Intake/Output Summary (Last 24 hours) at 08/21/2019 0846 Last data filed at 08/21/2019 [id number] Gross per 24 hour  Intake 1236.4 ml  Output 1985 ml  Net -748.6 ml     Physical Exam: General: Thin cachectic looking gentleman  HEENT [place] tube in place with dark aspirate  Pulm/lungs [place] O2  CVS/Heart  regular, tachycardic  Abdomen:  Tender, surgical dressing in place  Extremities:  No peripheral edema  Neurologic:  Sleepy but arousable, minimal interaction  Skin:  Warm          Basic Metabolic Panel:  Recent Labs  Lab 08/17/19 0516 Aug 12, 2019 0524 08/19/19 0545 08/20/19 0529 08/21/19 0519  NA 138 141 145 151* 154*  K 3.5 3.3* 3.7 3.1* 3.0*  CL 84* 81* 94* 92* 94*  CO2 33* 36* 34* 33* 43*  GLUCOSE 138* 100* 104* 96 181*  BUN 58* 78* 86* 87* 98*  CREATININE 3.15* 4.44* 3.97* 3.35* 2.75*  CALCIUM 8.6* 8.4* 8.2* 8.9 8.9  MG 2.0 2.3 2.4 2.6* 2.8*  PHOS 6.6* 6.9* 6.0* 4.3 2.2*     CBC: Recent Labs  Lab 08/17/19 0516 Aug 12, 2019 0524 08/19/19 0545 08/20/19 0529 08/20/19 1757 08/21/19 0519  WBC 6.8 6.1 9.5 9.4  --  13.8*  HGB 15.5 15.1 14.0 12.9* 11.8* 11.2*  HCT 45.2 45.0 43.2 40.3 37.4* 36.1*  MCV 95.2 98.3 100.2* 102.5*  --  104.9*  PLT 222  225 226 226  --  218     No results found for: HEPBSAG, HEPBSAB, HEPBIGM    Microbiology:  Recent Results (from the past 240 hour(s))  SARS Coronavirus 2 by RT PCR (hospital order, performed in [facility] hospital lab) Nasopharyngeal Nasopharyngeal Swab     Status: None   Collection Time: 07/31/2019  6:54 PM   Specimen: Nasopharyngeal Swab  Result Value Ref Range Status   SARS Coronavirus 2 NEGATIVE NEGATIVE Final    Comment: (NOTE) If result is NEGATIVE SARS-CoV-2 target nucleic acids are NOT DETECTED. The SARS-CoV-2 RNA is generally detectable in upper and lower  respiratory specimens during the acute phase of infection. The lowest  concentration of SARS-CoV-2 viral copies this assay can detect is 250  copies / mL. A negative result does not preclude SARS-CoV-2 infection  and should not be used as the sole basis for treatment or other  patient management decisions.  A negative result may occur with  improper specimen collection / handling, submission of specimen other  than nasopharyngeal swab, presence of viral mutation(s) within the  areas targeted by this assay, and inadequate number of viral copies  (<250 copies / mL). A negative result must be combined with clinical  observations, patient history, and epidemiological information. If result is POSITIVE  SARS-CoV-2 target nucleic acids are DETECTED. The SARS-CoV-2 RNA is generally detectable in upper and lower  respiratory specimens dur ing the acute phase of infection.  Positive  results are indicative of active infection with SARS-CoV-2.  Clinical  correlation with patient history and other diagnostic information is  necessary to determine patient infection status.  Positive results do  not rule out bacterial infection or co-infection with other viruses. If result is PRESUMPTIVE POSTIVE SARS-CoV-2 nucleic acids MAY BE PRESENT.   A presumptive positive result was obtained on the submitted specimen  and confirmed on repeat  testing.  While 2019 novel coronavirus  (SARS-CoV-2) nucleic acids may be present in the submitted sample  additional confirmatory testing may be necessary for epidemiological  and / or clinical management purposes  to differentiate between  SARS-CoV-2 and other Sarbecovirus currently known to infect humans.  If clinically indicated additional testing with an alternate test  methodology [phone]) is advised. The SARS-CoV-2 RNA is generally  detectable in upper and lower respiratory sp ecimens during the acute  phase of infection. The expected result is Negative. Fact Sheet for Patients:  StrictlyIdeas.no Fact Sheet for Healthcare Providers: BankingDealers.co.za This test is not yet approved or cleared by the [place] FDA and has been authorized for detection and/or diagnosis of SARS-CoV-2 by FDA under an Emergency Use Authorization (EUA).  This EUA will remain in effect (meaning this test can be used) for the duration of the COVID-19 declaration under Section 564(b)(1) of the Act, 21 U.S.C. section 360bbb-3(b)(1), unless the authorization is terminated or revoked sooner. Performed at [facility], [address] [place], [place] [address]   MRSA PCR Screening     Status: None   Collection Time: 08/20/19  1:36 PM   Specimen: Nasopharyngeal  Result Value Ref Range Status   MRSA by PCR NEGATIVE NEGATIVE Final    Comment:        The GeneXpert MRSA Assay (FDA approved for NASAL specimens only), is one component of a comprehensive MRSA colonization surveillance program. It is not intended to diagnose MRSA infection nor to guide or monitor treatment for MRSA infections. Performed at [facility], [address] [place], [place] [address]     Coagulation Studies: No results for input(s): LABPROT, INR in the last 72 hours.  Urinalysis: No results for input(s): COLORURINE, LABSPEC, PHURINE, GLUCOSEU, HGBUR,  BILIRUBINUR, KETONESUR, PROTEINUR, UROBILINOGEN, NITRITE, LEUKOCYTESUR in the last 72 hours.  Invalid input(s): APPERANCEUR    Imaging: Dg Chest Port 1 View  Result Date: 08/20/2019 CLINICAL DATA:  Hypoxia EXAM: PORTABLE CHEST 1 VIEW COMPARISON:  None. FINDINGS: There is hazy right lower lobe airspace disease most concerning for pneumonia. There is mild hazy left lower lobe airspace disease which may reflect atelectasis versus pneumonia. There is no pleural effusion or pneumothorax. The cardiomediastinal silhouette is normal. There is no acute osseous abnormality. There is a nasogastric tube with the tip projecting over the stomach, but the proximal port is above the esophagogastric junction. Recommend advancing nasogastric tube 10 cm. IMPRESSION: 1. Hazy right lower lobe airspace disease most concerning for pneumonia. Mild hazy left lower lobe airspace disease which may reflect atelectasis versus pneumonia. 2. Nasogastric tube with the tip projecting over the stomach, but the proximal port is above the esophagogastric junction. Recommend advancing nasogastric tube 10 cm. Electronically Signed   By: Kathreen Devoid   On: 08/20/2019 14:23     Medications:   . dextrose 5 % and 0.45 % NaCl with KCl  40 mEq/L 75 mL/hr at 08/21/19 0629   . chlorhexidine  15 mL Mouth Rinse BID  . Chlorhexidine Gluconate Cloth  6 each Topical Daily  . folic acid  1 mg Oral Daily  . heparin injection (subcutaneous)  5,000 Units Subcutaneous Q8H  . mouth rinse  15 mL Mouth Rinse q12n4p  . multivitamin with minerals  1 tablet Oral Daily  . thiamine  100 mg Oral Daily   Or  . thiamine  100 mg Intravenous Daily   HYDROmorphone (DILAUDID) injection, labetalol, nicotine, ondansetron **OR** ondansetron (ZOFRAN) IV  Assessment/ Plan:  68 y.o. African-American male with chronic alcoholism, current smoker, presented with small bowel obstruction on October 21.   Patient Active Problem List   Diagnosis Date Noted  . SBO  (small bowel obstruction) (HCC) 08/05/2019  GERD Alcoholism  #Acute kidney injury -Nonoliguric.  Urine output recorded at 900 cc last 24 hours -Continue hydration and conservative management  #Hypernatremia -Worse overnight.  Sodium up to 154 -Change IV fluids to D5W  #Hypokalemia -Replace IV  #Metabolic alkalosis -Due to NG tube suction - monitor closely - will need saline replacement once Na is corrected  # SBO - Underwent exploratory laparotomy with extensive lysis of adhesions complicated by ATN/AKI. Bowel function has not returned yet. NG suction continued  # Acute resp failure - requiring 6 L [place] O2    LOS: 5 Johntavius Shepard 10/26/20208:46 AM  [facility] [place], [place] [phone]  Note: This note was prepared with Dragon dictation. Any transcription errors are unintentional

## 2019-08-21 NOTE — Op Note (Signed)
Preoperative diagnosis: Small bowel obstruction  Postoperative diagnosis: Same  Procedure: Exploratory laparotomy lysis of adhesions Anesthesia: General  Surgeon: Dr. Lysle Pearl  Wound Classification: Clean  Specimen: none  Complications: None  Estimated Blood Loss: 73mL   Indications:  Patient is a 68 y.o. male admitted for small bowel obstruction that did not resolve with nonsurgical management.  Findings: 1.  Multiple dense adhesions from previous surgery  2.  All viable loops of bowel 3. Adequate hemostasis achieved  Description of procedure: The patient was taken to the operating room. A time-out was completed verifying correct patient, procedure, site, positioning, and implant(s) and/or special equipment prior to beginning this procedure.  Foley placed.  Midline incision was made and dissection carried down to fascia.  Upon entering the peritoneal cavity extensive loops of dilated bowel along with distal decompressed small bowel was noted to the cecum.  Multiple loops of bowel are involved with dense adhesions along the midline incision from previous surgery, along with multiple adhesions in between the bowel.  All these adhesions were meticulously taken down using combination of sharp dissection, blunt dissection, and electrocautery.  Once all visible adhesions were taken down, inspection the bowel noted couple serosal tear injuries along aspects of adhesiolysis, and these were repaired using 3-0 silk in an interrupted fashion.  A clear transition point was noted at the area of the most dense area of adhesion, all bowel remained viable grossly.  The bowel contents were gently introduced the abdominal cavity noting no twisting of the mesentery.  Midline incision was then closed with 1 PDS x2 after infusion of Exparel.  Skin incision was closed with staples.  The patient tolerated the procedure well, Foley removed, and was taken to the postanesthesia care unit in stable condition.  Sponge and instrument count correct at end of procedure.

## 2019-08-21 NOTE — Consult Note (Signed)
PHARMACY CONSULT NOTE - FOLLOW UP  Pharmacy Consult for Electrolyte Monitoring and Replacement   Recent Labs: Potassium (mmol/L)  Date Value  08/21/2019 3.0 (L)   Magnesium (mg/dL)  Date Value  08/21/2019 2.8 (H)   Calcium (mg/dL)  Date Value  08/21/2019 8.9   Albumin (g/dL)  Date Value  08/08/2019 4.4   Phosphorus (mg/dL)  Date Value  08/21/2019 2.2 (L)   Sodium (mmol/L)  Date Value  08/21/2019 154 (H)     Assessment:  67 y.o.malewith small bowel obstruction3 Day Post-Ops/p exploratory laparotomy with extensive lysis of adhesions. He was started on D51/2NS w/ 40 mEq/L on 10/25 which was infused at 75 mL/hr for approximately 20 hours  Goal of Therapy:  Electrolytes WNL   Plan:   Na is trending up: based on the recommendations of Dr Candiss Norse fluids are being changed to D5 w/ 20 mEq/L to infuse at 75 mL/hr  Replace phosphorous (and potassium) with 20 mmol IV potassium phosphate. This provides 29.5 mEq additional potassium  Re-check electrolytes in am  Dallie Piles ,PharmD Clinical Pharmacist 08/21/2019 7:05 AM

## 2019-08-22 DIAGNOSIS — K56609 Unspecified intestinal obstruction, unspecified as to partial versus complete obstruction: Secondary | ICD-10-CM | POA: Diagnosis not present

## 2019-08-22 DIAGNOSIS — J9601 Acute respiratory failure with hypoxia: Secondary | ICD-10-CM | POA: Diagnosis not present

## 2019-08-22 LAB — BASIC METABOLIC PANEL
Anion gap: 12 (ref 5–15)
BUN: 107 mg/dL — ABNORMAL HIGH (ref 8–23)
CO2: 41 mmol/L — ABNORMAL HIGH (ref 22–32)
Calcium: 7.9 mg/dL — ABNORMAL LOW (ref 8.9–10.3)
Chloride: 99 mmol/L (ref 98–111)
Creatinine, Ser: 3.7 mg/dL — ABNORMAL HIGH (ref 0.61–1.24)
GFR calc Af Amer: 19 mL/min — ABNORMAL LOW (ref >60)
GFR calc non Af Amer: 16 mL/min — ABNORMAL LOW (ref >60)
Glucose, Bld: 181 mg/dL — ABNORMAL HIGH (ref 70–99)
Potassium: 4 mmol/L (ref 3.5–5.1)
Sodium: 152 mmol/L — ABNORMAL HIGH (ref 135–145)

## 2019-08-22 LAB — CBC WITH DIFFERENTIAL/PLATELET
Abs Immature Granulocytes: 1.27 10*3/uL — ABNORMAL HIGH (ref 0.00–0.07)
Basophils Absolute: 0 10*3/uL (ref 0.0–0.1)
Basophils Relative: 0 %
Eosinophils Absolute: 0 10*3/uL (ref 0.0–0.5)
Eosinophils Relative: 0 %
HCT: 28.7 % — ABNORMAL LOW (ref 39.0–52.0)
Hemoglobin: 8.3 g/dL — ABNORMAL LOW (ref 13.0–17.0)
Immature Granulocytes: 4 %
Lymphocytes Relative: 5 %
Lymphs Abs: 1.6 10*3/uL (ref 0.7–4.0)
MCH: 33.2 pg (ref 26.0–34.0)
MCHC: 28.9 g/dL — ABNORMAL LOW (ref 30.0–36.0)
MCV: 114.8 fL — ABNORMAL HIGH (ref 80.0–100.0)
Monocytes Absolute: 2.1 10*3/uL — ABNORMAL HIGH (ref 0.1–1.0)
Monocytes Relative: 7 %
Neutro Abs: 26.4 10*3/uL — ABNORMAL HIGH (ref 1.7–7.7)
Neutrophils Relative %: 84 %
Platelets: 177 10*3/uL (ref 150–400)
RBC: 2.5 MIL/uL — ABNORMAL LOW (ref 4.22–5.81)
RDW: 14.6 % (ref 11.5–15.5)
Smear Review: NORMAL
WBC: 31.4 10*3/uL — ABNORMAL HIGH (ref 4.0–10.5)
nRBC: 0.3 % — ABNORMAL HIGH (ref 0.0–0.2)

## 2019-08-22 LAB — BLOOD GAS, ARTERIAL
Acid-Base Excess: 20.1 mmol/L — ABNORMAL HIGH (ref 0.0–2.0)
Bicarbonate: 47.2 mmol/L — ABNORMAL HIGH (ref 20.0–28.0)
FIO2: 0.75
MECHVT: 500 mL
O2 Saturation: 99.5 %
PEEP: 10 cmH2O
Patient temperature: 37
RATE: 16 resp/min
pCO2 arterial: 62 mmHg — ABNORMAL HIGH (ref 32.0–48.0)
pH, Arterial: 7.49 — ABNORMAL HIGH (ref 7.350–7.450)
pO2, Arterial: 151 mmHg — ABNORMAL HIGH (ref 83.0–108.0)

## 2019-08-22 LAB — MAGNESIUM: Magnesium: 2.3 mg/dL (ref 1.7–2.4)

## 2019-08-22 LAB — PHOSPHORUS: Phosphorus: 3.3 mg/dL (ref 2.5–4.6)

## 2019-08-22 MED ORDER — STERILE WATER FOR INJECTION IJ SOLN
INTRAMUSCULAR | Status: AC
  Filled 2019-08-22: qty 10

## 2019-08-22 MED ORDER — LACTATED RINGERS IV BOLUS
1000.0000 mL | Freq: Once | INTRAVENOUS | Status: AC
  Administered 2019-08-22: 01:00:00 1000 mL via INTRAVENOUS

## 2019-08-22 MED ORDER — IBUPROFEN 400 MG PO TABS
800.0000 mg | ORAL_TABLET | Freq: Once | ORAL | Status: AC
  Administered 2019-08-22: 12:00:00 800 mg via ORAL
  Filled 2019-08-22: qty 2

## 2019-08-22 MED ORDER — STERILE WATER FOR INJECTION IJ SOLN
INTRAMUSCULAR | Status: AC
  Administered 2019-08-22: 10 mL
  Filled 2019-08-22: qty 10

## 2019-08-22 MED ORDER — DEXTROSE 5 % IV SOLN
INTRAVENOUS | Status: DC
  Administered 2019-08-22 – 2019-08-23 (×3): via INTRAVENOUS

## 2019-08-22 MED ORDER — LORAZEPAM 2 MG/ML IJ SOLN
1.0000 mg | INTRAMUSCULAR | Status: DC | PRN
  Administered 2019-08-22 (×2): 2 mg via INTRAVENOUS
  Filled 2019-08-22 (×2): qty 1

## 2019-08-22 MED ORDER — FAMOTIDINE 20 MG PO TABS
20.0000 mg | ORAL_TABLET | Freq: Every day | ORAL | Status: DC
  Administered 2019-08-22: 20 mg via ORAL
  Filled 2019-08-22: qty 1

## 2019-08-22 MED ORDER — CHLORHEXIDINE GLUCONATE 0.12% ORAL RINSE (MEDLINE KIT)
15.0000 mL | Freq: Two times a day (BID) | OROMUCOSAL | Status: DC
  Administered 2019-08-22 – 2019-08-23 (×2): 15 mL via OROMUCOSAL

## 2019-08-22 MED ORDER — ORAL CARE MOUTH RINSE
15.0000 mL | OROMUCOSAL | Status: DC
  Administered 2019-08-22 – 2019-08-23 (×8): 15 mL via OROMUCOSAL

## 2019-08-22 MED ORDER — LACTATED RINGERS IV BOLUS
1000.0000 mL | Freq: Once | INTRAVENOUS | Status: AC
  Administered 2019-08-22: 04:00:00 1000 mL via INTRAVENOUS

## 2019-08-22 MED ORDER — SENNOSIDES-DOCUSATE SODIUM 8.6-50 MG PO TABS
2.0000 | ORAL_TABLET | Freq: Every day | ORAL | Status: DC
  Administered 2019-08-22: 21:00:00 2 via ORAL
  Filled 2019-08-22: qty 2

## 2019-08-22 MED ORDER — LACTATED RINGERS IV BOLUS
1000.0000 mL | Freq: Once | INTRAVENOUS | Status: AC
  Administered 2019-08-22: 08:00:00 1000 mL via INTRAVENOUS

## 2019-08-22 MED ORDER — VECURONIUM BROMIDE 10 MG IV SOLR
10.0000 mg | INTRAVENOUS | Status: DC | PRN
  Administered 2019-08-21 – 2019-08-22 (×2): 10 mg via INTRAVENOUS
  Filled 2019-08-22: qty 10

## 2019-08-22 MED ORDER — VECURONIUM BROMIDE 10 MG IV SOLR
INTRAVENOUS | Status: AC
  Administered 2019-08-22: 10 mg via INTRAVENOUS
  Filled 2019-08-22: qty 10

## 2019-08-22 MED ORDER — ACETAMINOPHEN 650 MG RE SUPP
650.0000 mg | RECTAL | Status: DC | PRN
  Administered 2019-08-22 (×2): 650 mg via RECTAL
  Filled 2019-08-22: qty 1

## 2019-08-22 NOTE — Progress Notes (Signed)
Nutrition Follow Up Note   DOCUMENTATION CODES:   Not applicable  INTERVENTION:   RD will monitor for GOC vs the need for nutrition support  Recommend TPN if family wishes to proceed with full aggressive care  Pt is at high refeed risk; recommend monitor K, Mg and P labs daily    NUTRITION DIAGNOSIS:   Inadequate oral intake related to acute illness(SBO) as evidenced by NPO status. -pt now intubated and ventilated   GOAL:   Provide needs based on ASPEN/SCCM guidelines  MONITOR:   Diet advancement, Vent status, Labs, Weight trends, Skin, I & O's  ASSESSMENT:   68 y.o. male with a PMH of alcoholism, GERD, PUD s/p ex lap and graham patch 2017 for perforated ulcer who presented with severe abdominal pain, nausea, and vomiting. CT abdomen/pelvis showed a high grade distal small bowel obstruction with pneumatosis within the dilated distal small bowel loops and portal venous gas in the liver.   Pt s/p ex lap and LOA 10/23  Pt transferred to CCU; required intubation for hypoxemic respiratory failure. Renal function worsening; pt may require CRRT. Pt with post-op ileus. NGT in place with 758ml output. Pt has now been without adequate nutrition for > 7 days. Palliative care consult pending to determine [place]. If family wishes to proceed with full aggressive care, would recommend initiation of TPN if ileus persists. Pt is at high refeed risk; pt started on dextrose today so will plan to check refeed labs tomorrow. No new weight since 10/23; will request daily weights.    Medications reviewed and include: pepcid, folic acid, heparin, MVI, solu-medrol, senokot, colace, thiamine, azithromycin, ceftriaxone, 5% dextrose @75ml /hr, fentanyl   Labs reviewed: Na 152(H), BUN 107(H), creat 3.70(H), P 3.3 wnl, Mg 2.3 wnl, AST 292(H), ALT 129(H), tbili 4.2(H) Wbc- 31.4(H), Hgb 8.3(L), Hct 28.7(L), MCV 114.8(H)  Patient is currently intubated on ventilator support MV: 8.0 L/min Temp (24hrs), Avg:99.4  F (37.4 C), Min:97.9 F (36.6 C), Max:103 F (39.4 C)  Propofol: none   MAP- >47mmHg  UOP- 322ml x 24 hrs  Diet Order:   Diet Order            Diet NPO time specified  Diet effective now             EDUCATION NEEDS:   No education needs have been identified at this time  Skin:  Skin Assessment: Reviewed RN Assessment  Last BM:  pta  Height:   Ht Readings from Last 1 Encounters:  08/21/19 5' 9.02" (1.753 m)    Weight:   Wt Readings from Last 1 Encounters:  08/26/2019 59 kg    Ideal Body Weight:  72.7 kg  BMI:  Body mass index is 19.2 kg/m.  Estimated Nutritional Needs:   Kcal:  1365kcal/day  Protein:  90-105g/day  Fluid:  >1.5L/day  Koleen Distance MS, RD, LDN Pager #- [phone] Office#- [phone] After Hours Pager: [phone]

## 2019-08-22 NOTE — Progress Notes (Signed)
Patient febrile this shift, despite APAP supp and 800 mgs Motrin, Per Dr Mortimer Fries we will wait out the fever as patient's tachycardia has improved and a cooling blanket or ice packs would be uncomfortable for him and the family will likely opt for comfort care this evening or tomorrow. Pt's brother Keith Vincent visited him this afternoon, stating he has been designated as the family spokesperson.  Keith Vincent requested the patient's house keys because "his rent is due and I need to get it paid for him," but no keys are with the patient's belongings and they were not in his old room on [address].  Keith Vincent declined to provide his contact information to [place] stating he has a new phone and does not know the number.  Therefore we still do not have contact information for this family. Patient appears comfortable on the vent at this time, BP is adequate but he is basically anuric at this point and NG output remains dark [place]/black and thick. Midline abdominal incision clean dry and approximated.

## 2019-08-22 NOTE — Progress Notes (Signed)
Name: Keith Vincent MRN: [id number] DOB: 1951/08/05     LOS: 88  SYNOPSIS 68 year old male with a history of GERD, chronic alcoholism, lifelong smoking, chronic anemia electrolyte imbalances, small bowel obstruction currently postop day 2 status post ex lap with extensive lysis of adhesions, development of AKI and acute hypoxemic respiratory failure.  Critical care consultation placed by surgery team for management of acute hypoxemic respiratory failure and multiple comorbid conditions.   CC  Follow up resp failure  HPI Remains critically ill On v ent fio2 at 60% NG to suction    Vent Mode: PRVC FiO2 (%):  [60 %-100 %] 60 % Set Rate:  [16 bmp-20 bmp] 16 bmp Vt Set:  [500 mL] 500 mL PEEP:  [5 cmH20-10 cmH20] 10 cmH20 Plateau Pressure:  [17 cmH20] 17 cmH20     MEDICATIONS   Current Medication:  Current Facility-Administered Medications:  .  azithromycin (ZITHROMAX) 500 mg in sodium chloride 0.9 % 250 mL IVPB, 500 mg, Intravenous, q1800, Flora Lipps, MD, Stopped at 08/21/19 1941 .  budesonide (PULMICORT) nebulizer solution 0.5 mg, 0.5 mg, Nebulization, BID, Ciji Boston, MD, 0.5 mg at 08/22/19 0732 .  cefTRIAXone (ROCEPHIN) 2 g in sodium chloride 0.9 % 100 mL IVPB, 2 g, Intravenous, q1800, Luisana Lutzke, MD, Last Rate: 200 mL/hr at 08/21/19 1658, 2 g at 08/21/19 1658 .  chlorhexidine (PERIDEX) 0.12 % solution 15 mL, 15 mL, Mouth Rinse, BID, Lanney Gins, Fuad, MD, 15 mL at 08/21/19 2149 .  Chlorhexidine Gluconate Cloth 2 % PADS 6 each, 6 each, Topical, Daily, Ottie Glazier, MD, 6 each at 08/21/19 1136 .  dextrose 5 % with KCl 20 mEq / L  infusion, 20 mEq, Intravenous, Continuous, Dallie Piles, RPH, Last Rate: 75 mL/hr at 08/22/19 0500 .  famotidine (PEPCID) IVPB 20 mg premix, 20 mg, Intravenous, Q24H, Blakeney, Dana G, NP, Last Rate: 100 mL/hr at 08/21/19 2150, 20 mg at 08/21/19 2150 .  fentaNYL (SUBLIMAZE) bolus via infusion 50-100 mcg, 50-100 mcg, Intravenous, Q2H  PRN, Awilda Bill, NP .  fentaNYL 2558mg in NS 2563m(1022mml) infusion-PREMIX, 0-400 mcg/hr, Intravenous, Continuous, Kam Rahimi, MD, Last Rate: 25 mL/hr at 08/22/19 0500, 250 mcg/hr at 08/22/19 0500 .  folic acid (FOLVITE) tablet 1 mg, 1 mg, Oral, Daily, Sakai, Isami, DO .  heparin injection 5,000 Units, 5,000 Units, Subcutaneous, Q8H, Sakai, Isami, DO, 5,000 Units at 08/22/19 0538 .  ipratropium-albuterol (DUONEB) 0.5-2.5 (3) MG/3ML nebulizer solution 3 mL, 3 mL, Nebulization, Q4H, Kerianne Gurr, MD, 3 mL at 08/22/19 0732 .  LORazepam (ATIVAN) injection 1-2 mg, 1-2 mg, Intravenous, Q4H PRN, BlaAwilda BillP, 2 mg at 08/22/19 0050 .  MEDLINE mouth rinse, 15 mL, Mouth Rinse, q12n4p, Aleskerov, Fuad, MD, 15 mL at 08/21/19 1659 .  methylPREDNISolone sodium succinate (SOLU-MEDROL) 40 mg/mL injection 40 mg, 40 mg, Intravenous, Q12H, Gracee Ratterree, MD, 40 mg at 08/22/19 0400 .  metoprolol tartrate (LOPRESSOR) injection 2.5-5 mg, 2.5-5 mg, Intravenous, Q6H PRN, BlaAwilda BillP .  multivitamin with minerals tablet 1 tablet, 1 tablet, Oral, Daily, Sakai, Isami, DO .  nicotine (NICODERM CQ - dosed in mg/24 hours) patch 21 mg, 21 mg, Transdermal, Daily PRN, Sakai, Isami, DO .  ondansetron (ZOFRAN-ODT) disintegrating tablet 4 mg, 4 mg, Oral, Q6H PRN **OR** ondansetron (ZOFRAN) injection 4 mg, 4 mg, Intravenous, Q6H PRN, Sakai, Isami, DO, 4 mg at 08/17/19 0049 .  phenylephrine (NEOSYNEPHRINE) 10-0.9 MG/250ML-% infusion, 0-400 mcg/min, Intravenous, Titrated, Blakeney, DanDreama SaaP, Stopped at 08/21/19  2151 .  thiamine (VITAMIN B-1) tablet 100 mg, 100 mg, Oral, Daily **OR** thiamine (B-1) injection 100 mg, 100 mg, Intravenous, Daily, Sakai, Isami, DO, 100 mg at 08/20/19 0837    REVIEW OF SYSTEMS  PATIENT IS UNABLE TO PROVIDE COMPLETE REVIEW OF SYSTEM S DUE TO SEVERE CRITICAL ILLNESS AND ENCEPHALOPATHY    PHYSICAL EXAMINATION   Vital Signs: Temp:  [97.7 F (36.5 C)-98.6 F (37 C)] 97.9 F  (36.6 C) (10/27 0400) Pulse Rate:  [112-137] 134 (10/27 0615) Resp:  [14-32] 16 (10/27 0615) BP: (74-114)/(58-85) 100/60 (10/27 0615) SpO2:  [86 %-96 %] 88 % (10/27 0615) FiO2 (%):  [60 %-100 %] 60 % (10/27 0734)  PHYSICAL EXAMINATION:  GENERAL:critically ill appearing, +resp distress HEAD: Normocephalic, atraumatic.  EYES: Pupils equal, round, reactive to light.  No scleral icterus.  MOUTH: Moist mucosal membrane. NECK: Supple. No thyromegaly. No nodules. No JVD.  PULMONARY: +rhonchi, +wheezing CARDIOVASCULAR: S1 and S2. Regular rate and rhythm. No murmurs, rubs, or gallops.  GASTROINTESTINAL: Soft, nontender, -distended. Positive bowel sounds.  MUSCULOSKELETAL: No swelling, clubbing, or edema.  NEUROLOGIC: obtunded SKIN:intact,warm,dry      PERTINENT DATA     Infusions: . azithromycin Stopped (08/21/19 1941)  . cefTRIAXone (ROCEPHIN)  IV 2 g (08/21/19 1658)  . dextrose 5 % with KCl 20 mEq / L 75 mL/hr at 08/22/19 0500  . famotidine (PEPCID) IV 20 mg (08/21/19 2150)  . fentaNYL infusion INTRAVENOUS 250 mcg/hr (08/22/19 0500)  . phenylephrine (NEO-SYNEPHRINE) Adult infusion Stopped (08/21/19 2151)   Scheduled Medications: . budesonide (PULMICORT) nebulizer solution  0.5 mg Nebulization BID  . chlorhexidine  15 mL Mouth Rinse BID  . Chlorhexidine Gluconate Cloth  6 each Topical Daily  . folic acid  1 mg Oral Daily  . heparin injection (subcutaneous)  5,000 Units Subcutaneous Q8H  . ipratropium-albuterol  3 mL Nebulization Q4H  . mouth rinse  15 mL Mouth Rinse q12n4p  . methylPREDNISolone (SOLU-MEDROL) injection  40 mg Intravenous Q12H  . multivitamin with minerals  1 tablet Oral Daily  . thiamine  100 mg Oral Daily   Or  . thiamine  100 mg Intravenous Daily   PRN Medications: fentaNYL, LORazepam, metoprolol tartrate, nicotine, ondansetron **OR** ondansetron (ZOFRAN) IV Hemodynamic parameters:   Intake/Output: 10/26 0701 - 10/27 0700 In: 1609.6 [I.V.:1359.8; IV  Piggyback:249.8] Out: 1050 [Urine:300; Emesis/NG output:750]  Ventilator  Settings: Vent Mode: PRVC FiO2 (%):  [60 %-100 %] 60 % Set Rate:  [16 bmp-20 bmp] 16 bmp Vt Set:  [500 mL] 500 mL PEEP:  [5 cmH20-10 cmH20] 10 cmH20 Plateau Pressure:  [17 cmH20] 17 cmH20        ASSESSMENT AND PLAN   Severe ACUTE Hypoxic and Hypercapnic Respiratory Failure aspiration pneumonia and encephalopthy from DT's with  SBO -continue Mechanical Ventilator support -continue Bronchodilator Therapy -Wean Fio2 and PEEP as tolerated -VAP/VENT bundle implementation -will perform SAT/SBT when respiratory parameters are met   SBO Follow up gen surgery recs  ACUTE KIDNEY INJURY/Renal Failure -follow chem 7 -follow UO -continue Foley Catheter-assess need -Avoid nephrotoxic agents    DVT/GI PRX ordered TRANSFUSIONS AS NEEDED MONITOR FSBS ASSESS the need for LABS as needed  ELECTROLYTES -follow labs as needed -replace as needed -pharmacy consultation and following   SEVERE ALCOHOL WITHDRAWAL -Therapy with Thiamine and MVI Intubated and sedated   Critical Care Time devoted to patient care services described in this note is 32 minutes.   Overall, patient is critically ill, prognosis is guarded.  Patient with Multiorgan   failure and at high risk for cardiac arrest and death.    Corrin Parker, M.D.  [facility]  Medical Director [place] Director [facility] Cardio-Pulmonary Department

## 2019-08-22 NOTE — Consult Note (Signed)
PHARMACY CONSULT NOTE - FOLLOW UP  Pharmacy Consult for Electrolyte Monitoring and Replacement   Recent Labs: Potassium (mmol/L)  Date Value  08/22/2019 4.0   Magnesium (mg/dL)  Date Value  08/22/2019 2.3   Calcium (mg/dL)  Date Value  08/22/2019 7.9 (L)   Albumin (g/dL)  Date Value  08/21/2019 2.4 (L)   Phosphorus (mg/dL)  Date Value  08/22/2019 3.3   Sodium (mmol/L)  Date Value  08/22/2019 152 (H)   Corrected Ca: 9.18 mg/dL  Assessment:  67 y.o.malewith small bowel obstruction4 Days Post-Ops/p exploratory laparotomy with extensive lysis of adhesions.   Goal of Therapy:  Electrolytes WNL   Plan:   Na is trending back down on D5  with AKI, patient is at risk of hyperkalemia: change to D5W w/o KCl at 75 mL/hr  No further electrolyte supplementation required today  Re-check electrolytes in am  Dallie Piles ,PharmD Clinical Pharmacist 08/22/2019 8:38 AM

## 2019-08-22 NOTE — Progress Notes (Signed)
[facility], [place] 08/22/19  Subjective:   Patient remains critically ill Hypotensive. Got intubated last evening Received 3 boluses of Lactated ringers NG tube in place to suction  sedated Nurse reports dwindling UOP  10/26 0701 - 10/27 0700 In: 1609.6 [I.V.:1359.8; IV Piggyback:249.8] Out: 1050 [Urine:300; Emesis/NG output:750]   Objective:  Vital signs in last 24 hours:  Temp:  [97.9 F (36.6 C)-98.6 F (37 C)] 97.9 F (36.6 C) (10/27 0400) Pulse Rate:  [112-137] 134 (10/27 0615) Resp:  [14-32] 16 (10/27 0615) BP: (74-114)/(58-85) 100/60 (10/27 0615) SpO2:  [86 %-96 %] 88 % (10/27 0615) FiO2 (%):  [60 %-100 %] 60 % (10/27 0734)  Weight change:  Filed Weights   07/28/2019 1546 08/03/2019 1330  Weight: 59 kg 59 kg    Intake/Output:    Intake/Output Summary (Last 24 hours) at 08/22/2019 0910 Last data filed at 08/22/2019 0558 Gross per 24 hour  Intake 1609.61 ml  Output 1050 ml  Net 559.61 ml     Physical Exam: General: Thin cachectic looking gentleman  HEENT  NGT in place  Pulm/lungs Vent assisted  CVS/Heart  regular, tachycardic  Abdomen:  Midline surgical incision, decreased bowel souds  Extremities:  No peripheral edema  Neurologic:  sedated  Skin:  Warm          Basic Metabolic Panel:  Recent Labs  Lab 08/10/2019 0524 08/19/19 0545 08/20/19 0529 08/21/19 0519 08/21/19 1856 08/22/19 0458  NA 141 145 151* 154* 156* 152*  K 3.3* 3.7 3.1* 3.0* 3.4* 4.0  CL 81* 94* 92* 94* 96* 99  CO2 36* 34* 33* 43* 45* 41*  GLUCOSE 100* 104* 96 181* 170* 181*  BUN 78* 86* 87* 98* 98* 107*  CREATININE 4.44* 3.97* 3.35* 2.75* 2.84* 3.70*  CALCIUM 8.4* 8.2* 8.9 8.9 8.7* 7.9*  MG 2.3 2.4 2.6* 2.8*  --  2.3  PHOS 6.9* 6.0* 4.3 2.2*  --  3.3     CBC: Recent Labs  Lab 08/17/19 0516 07/29/2019 0524 08/19/19 0545 08/20/19 0529 08/20/19 1757 08/21/19 0519  WBC 6.8 6.1 9.5 9.4  --  13.8*  HGB 15.5 15.1 14.0 12.9* 11.8* 11.2*   HCT 45.2 45.0 43.2 40.3 37.4* 36.1*  MCV 95.2 98.3 100.2* 102.5*  --  104.9*  PLT 222 225 226 226  --  218     No results found for: HEPBSAG, HEPBSAB, HEPBIGM    Microbiology:  Recent Results (from the past 240 hour(s))  SARS Coronavirus 2 by RT PCR (hospital order, performed in [facility] hospital lab) Nasopharyngeal Nasopharyngeal Swab     Status: None   Collection Time: 08/14/2019  6:54 PM   Specimen: Nasopharyngeal Swab  Result Value Ref Range Status   SARS Coronavirus 2 NEGATIVE NEGATIVE Final    Comment: (NOTE) If result is NEGATIVE SARS-CoV-2 target nucleic acids are NOT DETECTED. The SARS-CoV-2 RNA is generally detectable in upper and lower  respiratory specimens during the acute phase of infection. The lowest  concentration of SARS-CoV-2 viral copies this assay can detect is 250  copies / mL. A negative result does not preclude SARS-CoV-2 infection  and should not be used as the sole basis for treatment or other  patient management decisions.  A negative result may occur with  improper specimen collection / handling, submission of specimen other  than nasopharyngeal swab, presence of viral mutation(s) within the  areas targeted by this assay, and inadequate number of viral copies  (<250 copies / mL). A  negative result must be combined with clinical  observations, patient history, and epidemiological information. If result is POSITIVE SARS-CoV-2 target nucleic acids are DETECTED. The SARS-CoV-2 RNA is generally detectable in upper and lower  respiratory specimens dur ing the acute phase of infection.  Positive  results are indicative of active infection with SARS-CoV-2.  Clinical  correlation with patient history and other diagnostic information is  necessary to determine patient infection status.  Positive results do  not rule out bacterial infection or co-infection with other viruses. If result is PRESUMPTIVE POSTIVE SARS-CoV-2 nucleic acids MAY BE PRESENT.   A  presumptive positive result was obtained on the submitted specimen  and confirmed on repeat testing.  While 2019 novel coronavirus  (SARS-CoV-2) nucleic acids may be present in the submitted sample  additional confirmatory testing may be necessary for epidemiological  and / or clinical management purposes  to differentiate between  SARS-CoV-2 and other Sarbecovirus currently known to infect humans.  If clinically indicated additional testing with an alternate test  methodology [phone]) is advised. The SARS-CoV-2 RNA is generally  detectable in upper and lower respiratory sp ecimens during the acute  phase of infection. The expected result is Negative. Fact Sheet for Patients:  StrictlyIdeas.no Fact Sheet for Healthcare Providers: BankingDealers.co.za This test is not yet approved or cleared by the [place] FDA and has been authorized for detection and/or diagnosis of SARS-CoV-2 by FDA under an Emergency Use Authorization (EUA).  This EUA will remain in effect (meaning this test can be used) for the duration of the COVID-19 declaration under Section 564(b)(1) of the Act, 21 U.S.C. section 360bbb-3(b)(1), unless the authorization is terminated or revoked sooner. Performed at [facility], [address] [place], [place] [address]   MRSA PCR Screening     Status: None   Collection Time: 08/20/19  1:36 PM   Specimen: Nasopharyngeal  Result Value Ref Range Status   MRSA by PCR NEGATIVE NEGATIVE Final    Comment:        The GeneXpert MRSA Assay (FDA approved for NASAL specimens only), is one component of a comprehensive MRSA colonization surveillance program. It is not intended to diagnose MRSA infection nor to guide or monitor treatment for MRSA infections. Performed at [facility], [address] [place], [place] [address]   CULTURE, BLOOD (ROUTINE X 2) w Reflex to ID Panel     Status: None (Preliminary  result)   Collection Time: 08/20/19  6:03 PM   Specimen: BLOOD  Result Value Ref Range Status   Specimen Description BLOOD LEFT ANTECUBITAL  Final   Special Requests   Final    BOTTLES DRAWN AEROBIC AND ANAEROBIC Blood Culture adequate volume   Culture   Final    NO GROWTH 2 DAYS Performed at [facility], [address], [place], [place] [address]    Report Status PENDING  Incomplete  CULTURE, BLOOD (ROUTINE X 2) w Reflex to ID Panel     Status: None (Preliminary result)   Collection Time: 08/20/19  6:06 PM   Specimen: BLOOD  Result Value Ref Range Status   Specimen Description BLOOD LEFT FOREARM  Final   Special Requests   Final    BOTTLES DRAWN AEROBIC AND ANAEROBIC Blood Culture adequate volume   Culture   Final    NO GROWTH 2 DAYS Performed at [facility], [address], [place], [place] [address]    Report Status PENDING  Incomplete    Coagulation Studies: No  results for input(s): LABPROT, INR in the last 72 hours.  Urinalysis: No results for input(s): COLORURINE, LABSPEC, PHURINE, GLUCOSEU, HGBUR, BILIRUBINUR, KETONESUR, PROTEINUR, UROBILINOGEN, NITRITE, LEUKOCYTESUR in the last 72 hours.  Invalid input(s): APPERANCEUR    Imaging: Dg Chest Port 1 View  Result Date: 08/21/2019 CLINICAL DATA:  Intubation EXAM: PORTABLE CHEST 1 VIEW COMPARISON:  Radiograph 08/20/2019 FINDINGS: Endotracheal tube terminates in the mid trachea, approximately 4.5 cm from the carina. Transesophageal tube tip and side port are peripherally positioned beyond the GE junction. Increasing right basilar opacity suspicious for pneumonia. Additional hazy airspace disease is noted in the left lung base also increased from comparison study. No pneumothorax or effusion. Cardiomediastinal contours are stable. No acute osseous or soft tissue abnormality. Additional support devices and monitoring leads overlie the chest. IMPRESSION: 1. Endotracheal tube terminates 4.5 cm from the carina.  2. Transesophageal tube tip and side port are peripherally positioned beyond the GE junction. 3. Increasing right lower lobe and left lower lobe airspace disease, suspicious for pneumonia. Electronically Signed   By: Kreg ShropshirePrice  DeHay M.D.   On: 08/21/2019 19:52   Dg Chest Port 1 View  Result Date: 08/20/2019 CLINICAL DATA:  Hypoxia EXAM: PORTABLE CHEST 1 VIEW COMPARISON:  None. FINDINGS: There is hazy right lower lobe airspace disease most concerning for pneumonia. There is mild hazy left lower lobe airspace disease which may reflect atelectasis versus pneumonia. There is no pleural effusion or pneumothorax. The cardiomediastinal silhouette is normal. There is no acute osseous abnormality. There is a nasogastric tube with the tip projecting over the stomach, but the proximal port is above the esophagogastric junction. Recommend advancing nasogastric tube 10 cm. IMPRESSION: 1. Hazy right lower lobe airspace disease most concerning for pneumonia. Mild hazy left lower lobe airspace disease which may reflect atelectasis versus pneumonia. 2. Nasogastric tube with the tip projecting over the stomach, but the proximal port is above the esophagogastric junction. Recommend advancing nasogastric tube 10 cm. Electronically Signed   By: Elige KoHetal  Patel   On: 08/20/2019 14:23     Medications:   . azithromycin Stopped (08/21/19 1941)  . cefTRIAXone (ROCEPHIN)  IV 2 g (08/21/19 1658)  . dextrose 5 % with KCl 20 mEq / L 75 mL/hr at 08/22/19 0500  . famotidine (PEPCID) IV 20 mg (08/21/19 2150)  . fentaNYL infusion INTRAVENOUS 250 mcg/hr (08/22/19 0500)  . phenylephrine (NEO-SYNEPHRINE) Adult infusion Stopped (08/21/19 2151)   . budesonide (PULMICORT) nebulizer solution  0.5 mg Nebulization BID  . chlorhexidine  15 mL Mouth Rinse BID  . Chlorhexidine Gluconate Cloth  6 each Topical Daily  . folic acid  1 mg Oral Daily  . heparin injection (subcutaneous)  5,000 Units Subcutaneous Q8H  . ipratropium-albuterol  3 mL  Nebulization Q4H  . mouth rinse  15 mL Mouth Rinse q12n4p  . methylPREDNISolone (SOLU-MEDROL) injection  40 mg Intravenous Q12H  . multivitamin with minerals  1 tablet Oral Daily  . thiamine  100 mg Oral Daily   Or  . thiamine  100 mg Intravenous Daily   acetaminophen, fentaNYL, LORazepam, metoprolol tartrate, nicotine, ondansetron **OR** ondansetron (ZOFRAN) IV  Assessment/ Plan:  68 y.o. African-American male with chronic alcoholism, current smoker, presented with small bowel obstruction on October 21.   Patient Active Problem List   Diagnosis Date Noted  . SBO (small bowel obstruction) (HCC) 08/22/2019    # Acute kidney injury -Oliguric.  Urine output recorded at 300 cc last 24 hours - oliguric AKI, likely secondary to ATN -  Overall clinical condition has worsened.  - recommend palliative care involvement in clarifying goals of care as decision to dialyze (CRRT) will need to be made soon.   # Acute resp failure - intubated 10/26, Fio2 60%  # Hypernatremia -Worse overnight.  Sodium up to 154 - getting D5W with KCl. Monitor lytes closely. - with AKI, patient is at risk of hyperkalemia - change to D5W (d/c Kcl)  # Hypokalemia - now in normal range  # Metabolic alkalosis - Due to NG tube suction - monitor closely - will need saline replacement once Na is corrected  # SBO - Underwent exploratory laparotomy with extensive lysis of adhesions complicated by ATN/AKI. Bowel function has not returned yet. NG suction continued      LOS: 6 Caprina Keith Vincent 10/27/20209:10 AM  [facility] [place], [place] [phone]  Note: This note was prepared with Dragon dictation. Any transcription errors are unintentional

## 2019-08-22 NOTE — Plan of Care (Addendum)
PMT note:  In to see patient. He is intubated and sedated. No family at bedside. No contact information listed in the demographics or elsewhere that I saw. Please message when contact information is available if consult is still needed.

## 2019-08-22 NOTE — Progress Notes (Signed)
Subjective:  CC: Keith Vincent is a 68 y.o. male  Hospital stay day 6, 4 Days Post-Op ex-lap, LOA for SBO  HPI: Pt intubated overnight.  Worsening prognosis  ROS:  Not completed due to above  Objective:   Temp:  [97.9 F (36.6 C)-103.3 F (39.6 C)] 103.3 F (39.6 C) (10/27 1500) Pulse Rate:  [112-137] 118 (10/27 1600) Resp:  [12-30] 16 (10/27 1600) BP: (74-112)/(58-85) 109/66 (10/27 1600) SpO2:  [88 %-96 %] 90 % (10/27 1600) FiO2 (%):  [60 %-100 %] 60 % (10/27 1538)     Height: 5' 9.02" (175.3 cm) Weight: 59 kg BMI (Calculated): 19.2   Intake/Output this shift:   Intake/Output Summary (Last 24 hours) at 08/22/2019 1652 Last data filed at 08/22/2019 1541 Gross per 24 hour  Intake 2323.59 ml  Output 532 ml  Net 1791.59 ml  NG still large amounts of dark bilious output  Constitutional :  Agitated, unable to answer questions.  Respiratory:  clear to auscultation bilaterally  Cardiovascular:  regular rate and rhythm  Gastrointestinal: soft, non-tender; bowel sounds normal; no masses,  no organomegaly.  Distention remains stable since previous exam. Staples c/d/i  Skin: Cool and moist.   Psychiatric: Normal affect, non-agitated, not confused       LABS:  CMP Latest Ref Rng & Units 08/22/2019 08/21/2019 08/21/2019  Glucose 70 - 99 mg/dL 181(H) 170(H) 181(H)  BUN 8 - 23 mg/dL 107(H) 98(H) 98(H)  Creatinine 0.61 - 1.24 mg/dL 3.70(H) 2.84(H) 2.75(H)  Sodium 135 - 145 mmol/L 152(H) 156(H) 154(H)  Potassium 3.5 - 5.1 mmol/L 4.0 3.4(L) 3.0(L)  Chloride 98 - 111 mmol/L 99 96(L) 94(L)  CO2 22 - 32 mmol/L 41(H) 45(H) 43(H)  Calcium 8.9 - 10.3 mg/dL 7.9(L) 8.7(L) 8.9  Total Protein 6.5 - 8.1 g/dL - 6.1(L) -  Total Bilirubin 0.3 - 1.2 mg/dL - 4.2(H) -  Alkaline Phos 38 - 126 U/L - 47 -  AST 15 - 41 U/L - 292(H) -  ALT 0 - 44 U/L - 129(H) -   CBC Latest Ref Rng & Units 08/22/2019 08/21/2019 08/20/2019  WBC 4.0 - 10.5 K/uL 31.4(H) 13.8(H) -  Hemoglobin 13.0 - 17.0 g/dL 8.3(L)  11.2(L) 11.8(L)  Hematocrit 39.0 - 52.0 % 28.7(L) 36.1(L) 37.4(L)  Platelets 150 - 400 K/uL 177 218 -    RADS: n/a Assessment:   S/p ex-lap, LOA for SBO.  Now likely with ileus.  Will continue with NG decompression, until flatus or BM.  Abdominal exam still reassuring for no additional/persistent intrabdominal pathology besides ileus at this time, but NG output still significant.  PNA, AKI, electrolytes per ICU team.  Prognosis poor, ICU provider in discussion of comfort care with family members per verbal report.  Surgery will continue to follow while in house

## 2019-08-23 DIAGNOSIS — Z515 Encounter for palliative care: Secondary | ICD-10-CM

## 2019-08-23 DIAGNOSIS — K56609 Unspecified intestinal obstruction, unspecified as to partial versus complete obstruction: Secondary | ICD-10-CM | POA: Diagnosis not present

## 2019-08-23 LAB — CBC
HCT: 18.5 % — ABNORMAL LOW (ref 39.0–52.0)
Hemoglobin: 5.6 g/dL — ABNORMAL LOW (ref 13.0–17.0)
MCH: 34.6 pg — ABNORMAL HIGH (ref 26.0–34.0)
MCHC: 30.3 g/dL (ref 30.0–36.0)
MCV: 114.2 fL — ABNORMAL HIGH (ref 80.0–100.0)
Platelets: 122 10*3/uL — ABNORMAL LOW (ref 150–400)
RBC: 1.62 MIL/uL — ABNORMAL LOW (ref 4.22–5.81)
RDW: 18.6 % — ABNORMAL HIGH (ref 11.5–15.5)
WBC: 45.5 10*3/uL — ABNORMAL HIGH (ref 4.0–10.5)
nRBC: 1.4 % — ABNORMAL HIGH (ref 0.0–0.2)

## 2019-08-23 LAB — BASIC METABOLIC PANEL
Anion gap: 15 (ref 5–15)
BUN: 151 mg/dL — ABNORMAL HIGH (ref 8–23)
CO2: 33 mmol/L — ABNORMAL HIGH (ref 22–32)
Calcium: 7.6 mg/dL — ABNORMAL LOW (ref 8.9–10.3)
Chloride: 96 mmol/L — ABNORMAL LOW (ref 98–111)
Creatinine, Ser: 5.89 mg/dL — ABNORMAL HIGH (ref 0.61–1.24)
GFR calc Af Amer: 11 mL/min — ABNORMAL LOW (ref >60)
GFR calc non Af Amer: 9 mL/min — ABNORMAL LOW (ref >60)
Glucose, Bld: 174 mg/dL — ABNORMAL HIGH (ref 70–99)
Potassium: 4.2 mmol/L (ref 3.5–5.1)
Sodium: 144 mmol/L (ref 135–145)

## 2019-08-23 LAB — PHOSPHORUS: Phosphorus: 4.4 mg/dL (ref 2.5–4.6)

## 2019-08-23 LAB — MAGNESIUM: Magnesium: 2.3 mg/dL (ref 1.7–2.4)

## 2019-08-23 MED ORDER — GLYCOPYRROLATE 1 MG PO TABS
1.0000 mg | ORAL_TABLET | ORAL | Status: DC | PRN
  Filled 2019-08-23: qty 1

## 2019-08-23 MED ORDER — MIDAZOLAM HCL 2 MG/2ML IJ SOLN: 2.0000 mg | INTRAMUSCULAR | Status: DC | PRN

## 2019-08-23 MED ORDER — ACETAMINOPHEN 650 MG RE SUPP: 650.0000 mg | Freq: Four times a day (QID) | RECTAL | Status: DC | PRN

## 2019-08-23 MED ORDER — CHLORHEXIDINE GLUCONATE 0.12 % MT SOLN
OROMUCOSAL | Status: AC
  Filled 2019-08-23: qty 15

## 2019-08-23 MED ORDER — DEXTROSE 5 % IV SOLN: INTRAVENOUS | Status: DC

## 2019-08-23 MED ORDER — MORPHINE SULFATE (PF) 2 MG/ML IV SOLN: 2.0000 mg | INTRAVENOUS | Status: DC | PRN

## 2019-08-23 MED ORDER — MORPHINE 100MG IN NS 100ML (1MG/ML) PREMIX INFUSION
0.0000 mg/h | INTRAVENOUS | Status: DC
  Administered 2019-08-23 (×2): 5 mg/h via INTRAVENOUS
  Filled 2019-08-23: qty 100

## 2019-08-23 MED ORDER — MORPHINE BOLUS VIA INFUSION
5.0000 mg | INTRAVENOUS | Status: DC | PRN
  Administered 2019-08-23: 5 mg via INTRAVENOUS
  Filled 2019-08-23: qty 5

## 2019-08-23 MED ORDER — DIPHENHYDRAMINE HCL 50 MG/ML IJ SOLN: 25.0000 mg | INTRAMUSCULAR | Status: DC | PRN

## 2019-08-23 MED ORDER — ACETAMINOPHEN 325 MG PO TABS: 650.0000 mg | ORAL_TABLET | Freq: Four times a day (QID) | ORAL | Status: DC | PRN

## 2019-08-23 MED ORDER — POLYVINYL ALCOHOL 1.4 % OP SOLN
1.0000 [drp] | Freq: Four times a day (QID) | OPHTHALMIC | Status: DC | PRN
  Filled 2019-08-23: qty 15

## 2019-08-23 MED ORDER — GLYCOPYRROLATE 0.2 MG/ML IJ SOLN: 0.2000 mg | INTRAMUSCULAR | Status: DC | PRN

## 2019-08-25 LAB — CULTURE, BLOOD (ROUTINE X 2)
Culture: NO GROWTH
Culture: NO GROWTH
Special Requests: ADEQUATE
Special Requests: ADEQUATE

## 2019-08-27 NOTE — Consult Note (Addendum)
Consultation Note Date: 2019/09/15   Patient Name: Keith Vincent  DOB: [birth year]  MRN: [id number]  Age / Sex: 68 y.o., male  PCP: Patient, No Pcp Per Referring Physician: Benjamine Sprague, DO  Reason for Consultation: Terminal Care  HPI/Patient Profile: 68 year old male with a history of GERD, chronic alcoholism, lifelong smoking, chronic anemia electrolyte imbalances, small bowel obstruction currently postop day 2 status post ex lap with  lysis of adhesions, development of multi-organ failure.   Clinical Assessment and Goals of Care: Patient has been extubated and is currently comfort care. Patient appears comfortable with no distress noted. His brother is at bedside. He states Keith Vincent had been complaining of "stomach problems" for around 3 weeks. He is at peace and has no questions. He reflects on memories of his brother. He states the patient and their mother were very close, and when she died around a year ago, he began to drink heavily. He feels his brother is ready to go be with their mother.  Support offered.     SUMMARY OF RECOMMENDATIONS   Comfort care initiated by CCM.       Symptom Management:   Initiated per primary team.   Prognosis:   Hours - Days  Discharge Planning: Anticipated Hospital Death      Primary Diagnoses: Present on Admission: . SBO (small bowel obstruction) ([place])   I have reviewed the medical record, interviewed the patient and family, and examined the patient. The following aspects are pertinent.  History reviewed. No pertinent past medical history. Social History   Socioeconomic History  . Marital status: Single    Spouse name: Not on file  . Number of children: Not on file  . Years of education: Not on file  . Highest education level: Not on file  Occupational History  . Not on file  Social Needs  . Financial resource strain: Not on file  . Food  insecurity    Worry: Not on file    Inability: Not on file  . Transportation needs    Medical: Not on file    Non-medical: Not on file  Tobacco Use  . Smoking status: Current Every Day Smoker    Types: Cigarettes  . Smokeless tobacco: Never Used  Substance and Sexual Activity  . Alcohol use: Yes    Alcohol/week: 3.0 standard drinks    Types: 3 Cans of beer per week  . Drug use: Not Currently  . Sexual activity: Not Currently  Lifestyle  . Physical activity    Days per week: Not on file    Minutes per session: Not on file  . Stress: Not on file  Relationships  . Social Herbalist on phone: Not on file    Gets together: Not on file    Attends religious service: Not on file    Active member of club or organization: Not on file    Attends meetings of clubs or organizations: Not on file    Relationship status: Not on file  Other  Topics Concern  . Not on file  Social History Narrative   Lives alone   History reviewed. No pertinent family history. Scheduled Meds: Continuous Infusions: . fentaNYL infusion INTRAVENOUS 200 mcg/hr (08/21/2019 1128)  . morphine 5 mg/hr (08/12/2019 1349)   PRN Meds:.acetaminophen, acetaminophen **OR** acetaminophen, diphenhydrAMINE, glycopyrrolate **OR** glycopyrrolate **OR** glycopyrrolate, midazolam, morphine injection, morphine, polyvinyl alcohol Medications Prior to Admission:  Prior to Admission medications   Not on File   No Known Allergies Review of Systems  Unable to perform ROS   Physical Exam Constitutional:      Comments: Resting with eyes closed.  Pulmonary:     Comments: Extubated. Respirations unlabored.     Vital Signs: BP 128/66   Pulse (!) 105   Temp 98.7 F (37.1 C) (Oral)   Resp 14   Ht 5' 9.02" (1.753 m)   Wt 58.2 kg   SpO2 (!) 89%   BMI 18.94 kg/m  Pain Scale: CPOT POSS *See Group Information*: S-Acceptable,Sleep, easy to arouse Pain Score: 0-No pain   SpO2: SpO2: (!) 89 % O2 Device:SpO2: (!) 89  % O2 Flow Rate: .O2 Flow Rate (L/min): 45 L/min  IO: Intake/output summary:   Intake/Output Summary (Last 24 hours) at 08/08/2019 1521 Last data filed at 08/22/2019 0800 Gross per 24 hour  Intake 813.05 ml  Output 335 ml  Net 478.05 ml    LBM: Last BM Date: 08/12/19 Baseline Weight: Weight: 59 kg Most recent weight: Weight: 58.2 kg     Palliative Assessment/Data:     Time In: 3:00 Time Out: 3:30 Time Total: 30 min Greater than 50%  of this time was spent counseling and coordinating care related to the above assessment and plan.  Signed by: Keith Stall, NP   Please contact Palliative Medicine Team phone at [phone] for questions and concerns.  For individual provider: See Loretha Stapler

## 2019-08-27 NOTE — Discharge Summary (Signed)
DEATH SUMMARY   Patient Details  Name: Keith Vincent MRN: [id number] DOB: May 20, 1951  Admission/Discharge Information   Admit Date:  08/30/2019  Date of Death: Date of Death: Sep 06, 2019  Time of Death: Time of Death: Mar 09, 2119  Length of Stay: 7  Referring Physician: Patient, No Pcp Per   Reason(s) for Hospitalization  Small bowel obstruction  Diagnoses  Preliminary cause of death: Multiorgan failure and sepsis Secondary Diagnoses (including complications and co-morbidities):  Active Problems:   SBO (small bowel obstruction) [facility])   Brief Hospital Course (including significant findings, care, treatment, and services provided and events leading to death)  DEON DUER is a 68 y.o. year old male who presented for small bowel obstruction.  Due to the lack of improvement with NG tube decompression, patient was taken to the OR for exploratory laparotomy and lysis of adhesions.  Please see op note for further details.  Patient's creatinine level started to increase around this time as well.  Postop, patient was recovering well initially but then developed desaturations.  Work-up led to likely pneumonia and patient was transferred to the ICU for further care.  Patient's condition continued to deteriorate with altered mental status, increasing creatinine levels leading to acute kidney injury, along with worsening leukocytosis and signs of sepsis.  Patient was eventually intubated and pressor support was required, but overall prognosis did not improve and remained poor.  After discussion with the ICU provider, family members decided to withdraw care and proceed with comfort care measures only.  She was extubated and then shortly passed away afterwards.   Pertinent Labs and Studies  Significant Diagnostic Studies Ct Abdomen Pelvis Wo Contrast  Result Date: 08-30-19 CLINICAL DATA:  Abdominal pain and nausea and vomiting for 5 days. EXAM: CT ABDOMEN AND PELVIS WITHOUT CONTRAST TECHNIQUE:  Multidetector CT imaging of the abdomen and pelvis was performed following the standard protocol without IV contrast. COMPARISON:  None. FINDINGS: Lower chest: No acute findings. Hepatobiliary: Several small low-attenuation lesions are seen in the left hepatic lobe measuring up to 1.4 cm in size, but cannot be characterized on this noncontrast study. Portal venous gas is seen throughout the right and left lobes. Gallbladder is unremarkable. Pancreas: No mass or inflammatory process visualized on this unenhanced exam. Spleen:  Within normal limits in size. Adrenals/Urinary tract: No evidence of urolithiasis or hydronephrosis. Unremarkable unopacified urinary bladder. Stomach/Bowel: Markedly dilated fluid-filled small bowel loops are seen throughout the abdomen and pelvis, with transition point in the region of the right lower quadrant. Pneumatosis is seen within dilated distal small bowel loops in the right lower abdomen. This is suspicious for bowel ischemia. Colon is nondilated. A few tiny air bubbles are seen just deep to the right rectus abdominus muscle, suspicious for free intraperitoneal air. No evidence of abscess. Vascular/Lymphatic: No pathologically enlarged lymph nodes identified. No evidence of abdominal aortic aneurysm. Aortic atherosclerosis. Reproductive:  No mass or other significant abnormality. Other:  None. Musculoskeletal:  No suspicious bone lesions identified. IMPRESSION: High-grade distal small bowel obstruction. Pneumatosis within dilated distal small bowel loops and portal venous gas in liver are highly suspicious for bowel ischemia. Probable tiny amount of free intraperitoneal air, highly suspicious for bowel perforation. Several small low-attenuation lesions in left hepatic lobe which cannot be characterized on this noncontrast study. Recommend further attention on follow-up CT or with MRI. Aortic Atherosclerosis (ICD10-I70.0). Critical Value/emergent results were called by telephone at  the time of interpretation on 30-Aug-2019 at 6:50 pm to provider MARK QUALE ,  who verbally acknowledged these results. Electronically Signed   By: Danae Orleans M.D.   On: 08/10/2019 18:50   US Renal  Result Date: 08/17/2019 CLINICAL DATA:  Acute kidney injury. EXAM: RENAL / URINARY TRACT ULTRASOUND COMPLETE COMPARISON:  CT abdomen and pelvis 08/02/2019. FINDINGS: Right Kidney: Renal measurements: 10.1 x 6.1 x 5.9 cm = volume: 188.4 mL . Echogenicity within normal limits. No mass or hydronephrosis visualized. Left Kidney: Renal measurements: 9.4 x 4.1 x 5.2 cm = volume: 104.9 mL. Echogenicity within normal limits. No mass or hydronephrosis visualized. Bladder: Appears normal for degree of bladder distention. Other: None. IMPRESSION: Negative for hydronephrosis.  Negative exam. Electronically Signed   By: Drusilla Kanner M.D.   On: 08/17/2019 11:01   Dg Chest Port 1 View  Result Date: 08/21/2019 CLINICAL DATA:  Intubation EXAM: PORTABLE CHEST 1 VIEW COMPARISON:  Radiograph 08/20/2019 FINDINGS: Endotracheal tube terminates in the mid trachea, approximately 4.5 cm from the carina. Transesophageal tube tip and side port are peripherally positioned beyond the GE junction. Increasing right basilar opacity suspicious for pneumonia. Additional hazy airspace disease is noted in the left lung base also increased from comparison study. No pneumothorax or effusion. Cardiomediastinal contours are stable. No acute osseous or soft tissue abnormality. Additional support devices and monitoring leads overlie the chest. IMPRESSION: 1. Endotracheal tube terminates 4.5 cm from the carina. 2. Transesophageal tube tip and side port are peripherally positioned beyond the GE junction. 3. Increasing right lower lobe and left lower lobe airspace disease, suspicious for pneumonia. Electronically Signed   By: Kreg Shropshire M.D.   On: 08/21/2019 19:52   Dg Chest Port 1 View  Result Date: 08/20/2019 CLINICAL DATA:  Hypoxia EXAM:  PORTABLE CHEST 1 VIEW COMPARISON:  None. FINDINGS: There is hazy right lower lobe airspace disease most concerning for pneumonia. There is mild hazy left lower lobe airspace disease which may reflect atelectasis versus pneumonia. There is no pleural effusion or pneumothorax. The cardiomediastinal silhouette is normal. There is no acute osseous abnormality. There is a nasogastric tube with the tip projecting over the stomach, but the proximal port is above the esophagogastric junction. Recommend advancing nasogastric tube 10 cm. IMPRESSION: 1. Hazy right lower lobe airspace disease most concerning for pneumonia. Mild hazy left lower lobe airspace disease which may reflect atelectasis versus pneumonia. 2. Nasogastric tube with the tip projecting over the stomach, but the proximal port is above the esophagogastric junction. Recommend advancing nasogastric tube 10 cm. Electronically Signed   By: Elige Ko   On: 08/20/2019 14:23   Dg Abd Portable 1v-small Bowel Obstruction Protocol-initial, 8 Hr Delay  Result Date: 08/17/2019 CLINICAL DATA:  Small-bowel obstruction, 8 hour delayed film EXAM: PORTABLE ABDOMEN - 1 VIEW COMPARISON:  Abdominal radiographs, CT abdomen pelvis 08/11/2019 FINDINGS: Esophagogastric tube is positioned with tip and side port below the diaphragm. There is enteric contrast in the dependent portions of the gastric fundus and in the descending portion of the duodenum. The proximal small bowel in the left upper quadrant remains extremely distended, maximum caliber of loops approximately 7.1 cm. There is gas present to the distal bowel and colon, with scattered stool in the right hemicolon and scattered gas present to the rectum. IMPRESSION: 1. Esophagogastric tube is positioned with tip and side port below the diaphragm. 2. There is enteric contrast in the dependent portions of the gastric fundus and in the descending portion of the duodenum, essentially no transit of contrast on 8 hour film. 3.  The proximal small  bowel in the left upper quadrant remains extremely distended, maximum caliber of loops approximately 7.1 cm. Distal transition point better assessed by prior CT. 4. There is gas present to the distal bowel and colon, with scattered stool in the right hemicolon and scattered gas present to the rectum, findings generally consistent with incomplete bowel obstruction and similar to prior CT. 5. Small volume pneumoperitoneum and portal venous gas identified by prior CT is not noted on radiographs. Electronically Signed   By: Lauralyn Primes M.D.   On: 08/17/2019 11:18   Dg Abd Portable 1v-small Bowel Protocol-position Verification  Result Date: 08/17/2019 CLINICAL DATA:  NG tube placement EXAM: PORTABLE ABDOMEN - 1 VIEW COMPARISON:  None. FINDINGS: Tip of the NG tube is seen projecting over the mid body of the stomach. Mildly dilated air-filled loops of small bowel in the mid abdomen. IMPRESSION: Tip of the NG tube seen projecting over the mid body of the stomach. Electronically Signed   By: Jonna Clark M.D.   On: 08/17/2019 02:50    Microbiology Recent Results (from the past 240 hour(s))  SARS Coronavirus 2 by RT PCR (hospital order, performed in [facility] hospital lab) Nasopharyngeal Nasopharyngeal Swab     Status: None   Collection Time: 08/11/2019  6:54 PM   Specimen: Nasopharyngeal Swab  Result Value Ref Range Status   SARS Coronavirus 2 NEGATIVE NEGATIVE Final    Comment: (NOTE) If result is NEGATIVE SARS-CoV-2 target nucleic acids are NOT DETECTED. The SARS-CoV-2 RNA is generally detectable in upper and lower  respiratory specimens during the acute phase of infection. The lowest  concentration of SARS-CoV-2 viral copies this assay can detect is 250  copies / mL. A negative result does not preclude SARS-CoV-2 infection  and should not be used as the sole basis for treatment or other  patient management decisions.  A negative result may occur with  improper specimen  collection / handling, submission of specimen other  than nasopharyngeal swab, presence of viral mutation(s) within the  areas targeted by this assay, and inadequate number of viral copies  (<250 copies / mL). A negative result must be combined with clinical  observations, patient history, and epidemiological information. If result is POSITIVE SARS-CoV-2 target nucleic acids are DETECTED. The SARS-CoV-2 RNA is generally detectable in upper and lower  respiratory specimens dur ing the acute phase of infection.  Positive  results are indicative of active infection with SARS-CoV-2.  Clinical  correlation with patient history and other diagnostic information is  necessary to determine patient infection status.  Positive results do  not rule out bacterial infection or co-infection with other viruses. If result is PRESUMPTIVE POSTIVE SARS-CoV-2 nucleic acids MAY BE PRESENT.   A presumptive positive result was obtained on the submitted specimen  and confirmed on repeat testing.  While 2019 novel coronavirus  (SARS-CoV-2) nucleic acids may be present in the submitted sample  additional confirmatory testing may be necessary for epidemiological  and / or clinical management purposes  to differentiate between  SARS-CoV-2 and other Sarbecovirus currently known to infect humans.  If clinically indicated additional testing with an alternate test  methodology [phone]) is advised. The SARS-CoV-2 RNA is generally  detectable in upper and lower respiratory sp ecimens during the acute  phase of infection. The expected result is Negative. Fact Sheet for Patients:  BoilerBrush.com.cy Fact Sheet for Healthcare Providers: https://pope.com/ This test is not yet approved or cleared by the [place] FDA and has been authorized for detection and/or  diagnosis of SARS-CoV-2 by FDA under an Emergency Use Authorization (EUA).  This EUA will remain in effect  (meaning this test can be used) for the duration of the COVID-19 declaration under Section 564(b)(1) of the Act, 21 U.S.C. section 360bbb-3(b)(1), unless the authorization is terminated or revoked sooner. Performed at [facility], [address] [place], [place] [address]   MRSA PCR Screening     Status: None   Collection Time: 08/20/19  1:36 PM   Specimen: Nasopharyngeal  Result Value Ref Range Status   MRSA by PCR NEGATIVE NEGATIVE Final    Comment:        The GeneXpert MRSA Assay (FDA approved for NASAL specimens only), is one component of a comprehensive MRSA colonization surveillance program. It is not intended to diagnose MRSA infection nor to guide or monitor treatment for MRSA infections. Performed at [facility], [address] [place], [place] [address]   CULTURE, BLOOD (ROUTINE X 2) w Reflex to ID Panel     Status: None (Preliminary result)   Collection Time: 08/20/19  6:03 PM   Specimen: BLOOD  Result Value Ref Range Status   Specimen Description BLOOD LEFT ANTECUBITAL  Final   Special Requests   Final    BOTTLES DRAWN AEROBIC AND ANAEROBIC Blood Culture adequate volume   Culture   Final    NO GROWTH 4 DAYS Performed at [facility], [address] [place], [place] [address]    Report Status PENDING  Incomplete  CULTURE, BLOOD (ROUTINE X 2) w Reflex to ID Panel     Status: None (Preliminary result)   Collection Time: 08/20/19  6:06 PM   Specimen: BLOOD  Result Value Ref Range Status   Specimen Description BLOOD LEFT FOREARM  Final   Special Requests   Final    BOTTLES DRAWN AEROBIC AND ANAEROBIC Blood Culture adequate volume   Culture   Final    NO GROWTH 4 DAYS Performed at [facility], [address] [place], [place] [address]    Report Status PENDING  Incomplete    Lab Basic Metabolic Panel: Recent Labs  Lab 08/19/19 0545 08/20/19 0529 08/21/19 0519 08/21/19 1856 08/22/19 0458 09-11-19 0509  NA  145 151* 154* 156* 152* 144  K 3.7 3.1* 3.0* 3.4* 4.0 4.2  CL 94* 92* 94* 96* 99 96*  CO2 34* 33* 43* 45* 41* 33*  GLUCOSE 104* 96 181* 170* 181* 174*  BUN 86* 87* 98* 98* 107* 151*  CREATININE 3.97* 3.35* 2.75* 2.84* 3.70* 5.89*  CALCIUM 8.2* 8.9 8.9 8.7* 7.9* 7.6*  MG 2.4 2.6* 2.8*  --  2.3 2.3  PHOS 6.0* 4.3 2.2*  --  3.3 4.4   Liver Function Tests: Recent Labs  Lab 08/21/19 1856  AST 292*  ALT 129*  ALKPHOS 47  BILITOT 4.2*  PROT 6.1*  ALBUMIN 2.4*   No results for input(s): LIPASE, AMYLASE in the last 168 hours. No results for input(s): AMMONIA in the last 168 hours. CBC: Recent Labs  Lab 08/19/19 0545 08/20/19 0529 08/20/19 1757 08/21/19 0519 08/22/19 0458 09/11/19 0509  WBC 9.5 9.4  --  13.8* 31.4* 45.5*  NEUTROABS  --   --   --   --  26.4*  --   HGB 14.0 12.9* 11.8* 11.2* 8.3* 5.6*  HCT 43.2 40.3 37.4* 36.1* 28.7* 18.5*  MCV 100.2* 102.5*  --  104.9* 114.8* 114.2*  PLT 226 226  --  218 177 122*   Cardiac  Enzymes: No results for input(s): CKTOTAL, CKMB, CKMBINDEX, TROPONINI in the last 168 hours. Sepsis Labs: Recent Labs  Lab 08/20/19 0529 08/21/19 0519 08/22/19 0458 07/28/2019 0509  WBC 9.4 13.8* 31.4* 45.5*    Procedures/Operations  Exploratory laparotomy, lysis of adhesions,   Eeshan Verbrugge 08/24/2019, 11:13 AM

## 2019-08-27 NOTE — Progress Notes (Signed)
Patient transferred from ICU. Morphine gtt infusing at 5. No signs of discomfort noted at this time. Patient is unresponsive. Madlyn Frankel, RN

## 2019-08-27 NOTE — Progress Notes (Signed)
Name: Keith Vincent MRN: [id number] DOB: 04/25/1951     LOS: 35  SYNOPSIS 68 year old male with a history of GERD, chronic alcoholism, lifelong smoking, chronic anemia electrolyte imbalances, small bowel obstruction currently postop day 2 status post ex lap with extensive lysis of adhesions, development of AKI and acute hypoxemic respiratory failure.  Critical care consultation placed by surgery team for management of acute hypoxemic respiratory failure and multiple comorbid conditions.   CC  Follow up resp failure  HPI Critically ill On vent Multiorgan failure WBC increasing NG with increaed Output Concerning for ischemic gut   Vent Mode: PRVC FiO2 (%):  [60 %] 60 % Set Rate:  [16 bmp] 16 bmp Vt Set:  [500 mL] 500 mL PEEP:  [10 cmH20] 10 cmH20 Plateau Pressure:  [17 cmH20] 17 cmH20     MEDICATIONS   Current Medication:  Current Facility-Administered Medications:  .  acetaminophen (TYLENOL) suppository 650 mg, 650 mg, Rectal, Q4H PRN, Flora Lipps, MD, 650 mg at 08/22/19 1657 .  azithromycin (ZITHROMAX) 500 mg in sodium chloride 0.9 % 250 mL IVPB, 500 mg, Intravenous, q1800, Flora Lipps, MD, Stopped at 08/22/19 2146 .  budesonide (PULMICORT) nebulizer solution 0.5 mg, 0.5 mg, Nebulization, BID, Nyzier Boivin, MD, 0.5 mg at 08/22/19 2034 .  cefTRIAXone (ROCEPHIN) 2 g in sodium chloride 0.9 % 100 mL IVPB, 2 g, Intravenous, q1800, Flora Lipps, MD, Stopped at 08/22/19 1726 .  chlorhexidine gluconate (MEDLINE KIT) (PERIDEX) 0.12 % solution 15 mL, 15 mL, Mouth Rinse, BID, Ramell Wacha, MD, 15 mL at 08/22/19 2000 .  Chlorhexidine Gluconate Cloth 2 % PADS 6 each, 6 each, Topical, Daily, Flora Lipps, MD, 6 each at 08/22/19 0924 .  dextrose 5 % solution, , Intravenous, Continuous, Denali Sharma, MD, Last Rate: 75 mL/hr at 08/22/19 2210 .  famotidine (PEPCID) tablet 20 mg, 20 mg, Oral, QHS, Jefry Lesinski, MD, 20 mg at 08/22/19 2126 .  fentaNYL (SUBLIMAZE) bolus via infusion  50-100 mcg, 50-100 mcg, Intravenous, Q2H PRN, Ceri Mayer, MD .  fentaNYL 2521mg in NS 253m(1060mml) infusion-PREMIX, 0-400 mcg/hr, Intravenous, Continuous, Christine Morton, MD, Last Rate: 20 mL/hr at 08/05/18/2000, 200 mcg/hr at 10/November 19, 202000 .  folic acid (FOLVITE) tablet 1 mg, 1 mg, Oral, Daily, Sakai, Isami, DO, 1 mg at 08/22/19 0803 .  heparin injection 5,000 Units, 5,000 Units, Subcutaneous, Q8H, Sakai, Isami, DO, 5,000 Units at 10/November 19, 202019 .  ipratropium-albuterol (DUONEB) 0.5-2.5 (3) MG/3ML nebulizer solution 3 mL, 3 mL, Nebulization, Q4H, Bintou Lafata, MD, 3 mL at 07/2019/08/1925 .  LORazepam (ATIVAN) injection 1-2 mg, 1-2 mg, Intravenous, Q4H PRN, KasFlora LippsD, 2 mg at 08/22/19 0847 .  MEDLINE mouth rinse, 15 mL, Mouth Rinse, 10 times per day, KasFlora LippsD, 15 mL at 10/11-19-202000 .  methylPREDNISolone sodium succinate (SOLU-MEDROL) 40 mg/mL injection 40 mg, 40 mg, Intravenous, Q12H, Estellar Cadena, MD, 40 mg at 10/11-19-202033 .  metoprolol tartrate (LOPRESSOR) injection 2.5-5 mg, 2.5-5 mg, Intravenous, Q6H PRN, KasFlora LippsD .  multivitamin with minerals tablet 1 tablet, 1 tablet, Oral, Daily, Sakai, Isami, DO, 1 tablet at 08/22/19 0803 .  nicotine (NICODERM CQ - dosed in mg/24 hours) patch 21 mg, 21 mg, Transdermal, Daily PRN, Sakai, Isami, DO .  ondansetron (ZOFRAN-ODT) disintegrating tablet 4 mg, 4 mg, Oral, Q6H PRN **OR** ondansetron (ZOFRAN) injection 4 mg, 4 mg, Intravenous, Q6H PRN, Sakai, Isami, DO, 4 mg at 08/17/19 0049 .  phenylephrine (NEOSYNEPHRINE) 10-0.9 MG/250ML-% infusion, 0-400 mcg/min, Intravenous, Titrated,  Flora Lipps, MD, Stopped at 08/21/19 2151 .  senna-docusate (Senokot-S) tablet 2 tablet, 2 tablet, Oral, QHS, Flora Lipps, MD, 2 tablet at 08/22/19 2126 .  thiamine (VITAMIN B-1) tablet 100 mg, 100 mg, Oral, Daily **OR** thiamine (B-1) injection 100 mg, 100 mg, Intravenous, Daily, Sakai, Isami, DO, 100 mg at 08/22/19 0802 .  vecuronium (NORCURON) injection  10 mg, 10 mg, Intravenous, Q1H PRN, Flora Lipps, MD, 10 mg at 08/22/19 0925   REVIEW OF SYSTEMS  PATIENT IS UNABLE TO PROVIDE COMPLETE REVIEW OF SYSTEM S DUE TO SEVERE CRITICAL ILLNESS AND ENCEPHALOPATHY    PHYSICAL EXAMINATION   Vital Signs: Temp:  [98.4 F (36.9 C)-103.3 F (39.6 C)] 98.6 F (37 C) (10/28 0400) Pulse Rate:  [106-132] 106 (10/28 0700) Resp:  [12-22] 20 (10/28 0700) BP: (94-124)/(63-72) 121/65 (10/28 0700) SpO2:  [83 %-91 %] 83 % (10/28 0700) FiO2 (%):  [60 %] 60 % (10/28 0400) Weight:  [58.2 kg] 58.2 kg (10/28 0500)  PHYSICAL EXAMINATION:  GENERAL:critically ill appearing, +resp distress HEAD: Normocephalic, atraumatic.  EYES: Pupils equal, round, reactive to light.  No scleral icterus.  MOUTH: Moist mucosal membrane. NECK: Supple. No thyromegaly. No nodules. No JVD.  PULMONARY: +rhonchi, +wheezing CARDIOVASCULAR: S1 and S2. Regular rate and rhythm. No murmurs, rubs, or gallops.  GASTROINTESTINAL: +distended. neg bowel sounds.  MUSCULOSKELETAL: No swelling, clubbing, or edema.  NEUROLOGIC: obtunded SKIN:intact,warm,dry        PERTINENT DATA     Infusions: . azithromycin Stopped (08/22/19 2146)  . cefTRIAXone (ROCEPHIN)  IV Stopped (08/22/19 1726)  . dextrose 75 mL/hr at 08/22/19 2210  . fentaNYL infusion INTRAVENOUS 200 mcg/hr (09-18-2019 0600)  . phenylephrine (NEO-SYNEPHRINE) Adult infusion Stopped (08/21/19 2151)   Scheduled Medications: . budesonide (PULMICORT) nebulizer solution  0.5 mg Nebulization BID  . chlorhexidine gluconate (MEDLINE KIT)  15 mL Mouth Rinse BID  . Chlorhexidine Gluconate Cloth  6 each Topical Daily  . famotidine  20 mg Oral QHS  . folic acid  1 mg Oral Daily  . heparin injection (subcutaneous)  5,000 Units Subcutaneous Q8H  . ipratropium-albuterol  3 mL Nebulization Q4H  . mouth rinse  15 mL Mouth Rinse 10 times per day  . methylPREDNISolone (SOLU-MEDROL) injection  40 mg Intravenous Q12H  . multivitamin with  minerals  1 tablet Oral Daily  . senna-docusate  2 tablet Oral QHS  . thiamine  100 mg Oral Daily   Or  . thiamine  100 mg Intravenous Daily   PRN Medications: acetaminophen, fentaNYL, LORazepam, metoprolol tartrate, nicotine, ondansetron **OR** ondansetron (ZOFRAN) IV, vecuronium Hemodynamic parameters:   Intake/Output: 10/27 0701 - 10/28 0700 In: 2542 [I.V.:897; NG/GT:100; IV Piggyback:450] Out: 567 [Urine:67; Emesis/NG output:500]  Ventilator  Settings: Vent Mode: PRVC FiO2 (%):  [60 %] 60 % Set Rate:  [16 bmp] 16 bmp Vt Set:  [500 mL] 500 mL PEEP:  [10 cmH20] 10 cmH20 Plateau Pressure:  [17 cmH20] 17 cmH20        ASSESSMENT AND PLAN   Severe ACUTE Hypoxic and Hypercapnic Respiratory Failure aspiration pneumonia and encephalopthy from DT's with SBO, MULTIORGAN FAILURE     Severe ACUTE Hypoxic and Hypercapnic Respiratory Failure -continue Mechanical Ventilator support -continue Bronchodilator Therapy -Wean Fio2 and PEEP as tolerated -VAP/VENT bundle implementation Unable to wean vent   ACUTE KIDNEY INJURY/Renal Failure -follow chem 7 -follow UO -continue Foley Catheter-assess need -Avoid nephrotoxic agents   GI-SBO GI PROPHYLAXIS as indicated Follow up gen surgery recs    DVT/GI PRX ordered TRANSFUSIONS  AS NEEDED MONITOR FSBS ASSESS the need for LABS as needed  ELECTROLYTES -follow labs as needed -replace as needed -pharmacy consultation and following   SEVERE ALCOHOL WITHDRAWAL -Therapy with Thiamine and MVI On vent   Critical Care Time devoted to patient care services described in this note is 32 minutes.   Overall, patient is critically ill, prognosis is guarded.  Patient with Multiorgan failure and at high risk for cardiac arrest and death.    Corrin Parker, M.D.  [facility]  Medical Director [place] Director [facility] Cardio-Pulmonary Department

## 2019-08-27 NOTE — Progress Notes (Signed)
Family At bedside, clinical status relayed to family  Updated and notified of patients medical condition-  Progressive multiorgan failure with very low chance of meaningful recovery.  Patient is in dying  Process.  Family understands the situation.  They have consented and agreed to DNR/DNI and would like to proceed with Comfort care measures.   Family are satisfied with Plan of action and management. All questions answered  Shanetha Bradham David Adelin Ventrella, M.D.  [facility]  Medical Director ICU-[facility] Medical Director [facility] Cardio-Pulmonary Department     

## 2019-08-27 NOTE — Progress Notes (Signed)
Clinical status relayed to family  Updated and notified of patients medical condition-  Progressive multiorgan failure with very low chance of meaningful recovery.  Patient is in dying  Process.  Family understands the situation.  Family to arrive to bedside   Corrin Parker, M.D.  [facility]  Medical Director [place] Director [facility] Cardio-Pulmonary Department

## 2019-08-27 NOTE — Progress Notes (Signed)
Eyes prepped

## 2019-08-27 NOTE — Progress Notes (Signed)
Extubation orders written. Patient extubated to comfort care measures @14 :88.  RN present for extubation.

## 2019-08-27 NOTE — Progress Notes (Signed)
Entered room to check on pt, noted without pulse and respirations, 2nd nurse verified Wiliam Ke RN, pt expired, emergency contact Freida Busman aware and states that they have already seen pt and collected belongings, signed funeral home release and that family would not come back to hospital tonight, pt to be transported to [place], Dr Bridgett Larsson aware, [facility] aware, donor services aware

## 2019-08-27 NOTE — Progress Notes (Addendum)
Pt transitioned to comfort care, Sister Margaretha Sheffield and brother Jeneen Rinks at bedside for several hours this afternoon.  All questions answered, pt extubated at approx 1440 to room air, morphine gtt in place.   Jeneen Rinks continues to ask what happened to patient's wallet; his pants are also missing.  Admission belongings charted as "clothing, wallet, dentures, cellphone."  No pants or wallet in bag.  Writing RN searched patient's old room on [address], and also called the ED and security.I  Also spoke with the OR if perhaps his wallet was in the bed with him when he went for his ABD surgery.  Wallet has not been located.  James aware, he has left his number in the chart if we find it.  Report called to [facility]. Family has left room after completing funeral home form with me.  Margaretha Sheffield took his belongings with her, including his dentures and cell phone

## 2019-08-27 NOTE — Progress Notes (Signed)
[facility], [place] Sep 01, 2019  Subjective:   Patient remains critically ill, sedated Hypotensive.  Remains ventilator dependent.  FiO2 60% OG tube in place to suction Essentially anuric.  Urine output 67 cc.  NG output 500 cc   10/27 0701 - 10/28 0700 In: 0802 [I.V.:897; NG/GT:100; IV Piggyback:450] Out: 567 [Urine:67; Emesis/NG output:500]   Objective:  Vital signs in last 24 hours:  Temp:  [98.4 F (36.9 C)-103.3 F (39.6 C)] 98.7 F (37.1 C) (10/28 0800) Pulse Rate:  [105-126] 105 (10/28 0800) Resp:  [12-22] 16 (10/28 0800) BP: (94-124)/(63-81) 120/81 (10/28 0800) SpO2:  [83 %-91 %] 89 % (10/28 0800) FiO2 (%):  [60 %] 60 % (10/28 0400) Weight:  [58.2 kg] 58.2 kg (10/28 0500)  Weight change:  Filed Weights   08/20/2019 1546 07/29/2019 1330 Sep 01, 2019 0500  Weight: 59 kg 59 kg 58.2 kg    Intake/Output:    Intake/Output Summary (Last 24 hours) at Sep 01, 2019 0957 Last data filed at 09-01-2019 0800 Gross per 24 hour  Intake 992.63 ml  Output 455 ml  Net 537.63 ml     Physical Exam: General: Thin cachectic gentleman, critically ill-appearing  HEENT  NGT in place  Pulm/lungs Vent assisted  CVS/Heart  regular, tachycardic  Abdomen:  Midline surgical incision, decreased bowel souds  Extremities:  No peripheral edema  Neurologic:  sedated  Skin:  Warm          Basic Metabolic Panel:  Recent Labs  Lab 08/19/19 0545 08/20/19 0529 08/21/19 0519 08/21/19 1856 08/22/19 0458 09/01/2019 0509  NA 145 151* 154* 156* 152* 144  K 3.7 3.1* 3.0* 3.4* 4.0 4.2  CL 94* 92* 94* 96* 99 96*  CO2 34* 33* 43* 45* 41* 33*  GLUCOSE 104* 96 181* 170* 181* 174*  BUN 86* 87* 98* 98* 107* 151*  CREATININE 3.97* 3.35* 2.75* 2.84* 3.70* 5.89*  CALCIUM 8.2* 8.9 8.9 8.7* 7.9* 7.6*  MG 2.4 2.6* 2.8*  --  2.3 2.3  PHOS 6.0* 4.3 2.2*  --  3.3 4.4     CBC: Recent Labs  Lab 08/19/19 0545 08/20/19 0529 08/20/19 1757 08/21/19 0519 08/22/19 0458  September 01, 2019 0509  WBC 9.5 9.4  --  13.8* 31.4* 45.5*  NEUTROABS  --   --   --   --  26.4*  --   HGB 14.0 12.9* 11.8* 11.2* 8.3* 5.6*  HCT 43.2 40.3 37.4* 36.1* 28.7* 18.5*  MCV 100.2* 102.5*  --  104.9* 114.8* 114.2*  PLT 226 226  --  218 177 122*     No results found for: HEPBSAG, HEPBSAB, HEPBIGM    Microbiology:  Recent Results (from the past 240 hour(s))  SARS Coronavirus 2 by RT PCR (hospital order, performed in [place] hospital lab) Nasopharyngeal Nasopharyngeal Swab     Status: None   Collection Time: 07/29/2019  6:54 PM   Specimen: Nasopharyngeal Swab  Result Value Ref Range Status   SARS Coronavirus 2 NEGATIVE NEGATIVE Final    Comment: (NOTE) If result is NEGATIVE SARS-CoV-2 target nucleic acids are NOT DETECTED. The SARS-CoV-2 RNA is generally detectable in upper and lower  respiratory specimens during the acute phase of infection. The lowest  concentration of SARS-CoV-2 viral copies this assay can detect is 250  copies / mL. A negative result does not preclude SARS-CoV-2 infection  and should not be used as the sole basis for treatment or other  patient management decisions.  A negative result may occur with  improper  specimen collection / handling, submission of specimen other  than nasopharyngeal swab, presence of viral mutation(s) within the  areas targeted by this assay, and inadequate number of viral copies  (<250 copies / mL). A negative result must be combined with clinical  observations, patient history, and epidemiological information. If result is POSITIVE SARS-CoV-2 target nucleic acids are DETECTED. The SARS-CoV-2 RNA is generally detectable in upper and lower  respiratory specimens dur ing the acute phase of infection.  Positive  results are indicative of active infection with SARS-CoV-2.  Clinical  correlation with patient history and other diagnostic information is  necessary to determine patient infection status.  Positive results do  not rule  out bacterial infection or co-infection with other viruses. If result is PRESUMPTIVE POSTIVE SARS-CoV-2 nucleic acids MAY BE PRESENT.   A presumptive positive result was obtained on the submitted specimen  and confirmed on repeat testing.  While 2019 novel coronavirus  (SARS-CoV-2) nucleic acids may be present in the submitted sample  additional confirmatory testing may be necessary for epidemiological  and / or clinical management purposes  to differentiate between  SARS-CoV-2 and other Sarbecovirus currently known to infect humans.  If clinically indicated additional testing with an alternate test  methodology [phone]) is advised. The SARS-CoV-2 RNA is generally  detectable in upper and lower respiratory sp ecimens during the acute  phase of infection. The expected result is Negative. Fact Sheet for Patients:  StrictlyIdeas.no Fact Sheet for Healthcare Providers: BankingDealers.co.za This test is not yet approved or cleared by the [place] FDA and has been authorized for detection and/or diagnosis of SARS-CoV-2 by FDA under an Emergency Use Authorization (EUA).  This EUA will remain in effect (meaning this test can be used) for the duration of the COVID-19 declaration under Section 564(b)(1) of the Act, 21 U.S.C. section 360bbb-3(b)(1), unless the authorization is terminated or revoked sooner. Performed at [facility], [address] [place], [place] [address]   MRSA PCR Screening     Status: None   Collection Time: 08/20/19  1:36 PM   Specimen: Nasopharyngeal  Result Value Ref Range Status   MRSA by PCR NEGATIVE NEGATIVE Final    Comment:        The GeneXpert MRSA Assay (FDA approved for NASAL specimens only), is one component of a comprehensive MRSA colonization surveillance program. It is not intended to diagnose MRSA infection nor to guide or monitor treatment for MRSA infections. Performed at [facility], [address] [place], [place] [address]   CULTURE, BLOOD (ROUTINE X 2) w Reflex to ID Panel     Status: None (Preliminary result)   Collection Time: 08/20/19  6:03 PM   Specimen: BLOOD  Result Value Ref Range Status   Specimen Description BLOOD LEFT ANTECUBITAL  Final   Special Requests   Final    BOTTLES DRAWN AEROBIC AND ANAEROBIC Blood Culture adequate volume   Culture   Final    NO GROWTH 3 DAYS Performed at [facility], [address], [place], [place] [address]    Report Status PENDING  Incomplete  CULTURE, BLOOD (ROUTINE X 2) w Reflex to ID Panel     Status: None (Preliminary result)   Collection Time: 08/20/19  6:06 PM   Specimen: BLOOD  Result Value Ref Range Status   Specimen Description BLOOD LEFT FOREARM  Final   Special Requests   Final    BOTTLES DRAWN AEROBIC AND ANAEROBIC Blood Culture adequate volume   Culture  Final    NO GROWTH 3 DAYS Performed at [facility], [address] [place], [place] [address]    Report Status PENDING  Incomplete    Coagulation Studies: No results for input(s): LABPROT, INR in the last 72 hours.  Urinalysis: No results for input(s): COLORURINE, LABSPEC, PHURINE, GLUCOSEU, HGBUR, BILIRUBINUR, KETONESUR, PROTEINUR, UROBILINOGEN, NITRITE, LEUKOCYTESUR in the last 72 hours.  Invalid input(s): APPERANCEUR    Imaging: Dg Chest Port 1 View  Result Date: 08/21/2019 CLINICAL DATA:  Intubation EXAM: PORTABLE CHEST 1 VIEW COMPARISON:  Radiograph 08/20/2019 FINDINGS: Endotracheal tube terminates in the mid trachea, approximately 4.5 cm from the carina. Transesophageal tube tip and side port are peripherally positioned beyond the GE junction. Increasing right basilar opacity suspicious for pneumonia. Additional hazy airspace disease is noted in the left lung base also increased from comparison study. No pneumothorax or effusion. Cardiomediastinal contours are stable. No acute osseous or soft tissue  abnormality. Additional support devices and monitoring leads overlie the chest. IMPRESSION: 1. Endotracheal tube terminates 4.5 cm from the carina. 2. Transesophageal tube tip and side port are peripherally positioned beyond the GE junction. 3. Increasing right lower lobe and left lower lobe airspace disease, suspicious for pneumonia. Electronically Signed   By: Lovena Le M.D.   On: 08/21/2019 19:52     Medications:   . azithromycin Stopped (08/22/19 2146)  . cefTRIAXone (ROCEPHIN)  IV Stopped (08/22/19 1726)  . dextrose 75 mL/hr at 08/22/19 2210  . fentaNYL infusion INTRAVENOUS 200 mcg/hr (09-08-2019 0600)  . phenylephrine (NEO-SYNEPHRINE) Adult infusion Stopped (08/21/19 2151)   . budesonide (PULMICORT) nebulizer solution  0.5 mg Nebulization BID  . chlorhexidine gluconate (MEDLINE KIT)  15 mL Mouth Rinse BID  . Chlorhexidine Gluconate Cloth  6 each Topical Daily  . famotidine  20 mg Oral QHS  . folic acid  1 mg Oral Daily  . ipratropium-albuterol  3 mL Nebulization Q4H  . mouth rinse  15 mL Mouth Rinse 10 times per day  . methylPREDNISolone (SOLU-MEDROL) injection  40 mg Intravenous Q12H  . multivitamin with minerals  1 tablet Oral Daily  . senna-docusate  2 tablet Oral QHS  . thiamine  100 mg Oral Daily   Or  . thiamine  100 mg Intravenous Daily   acetaminophen, fentaNYL, LORazepam, metoprolol tartrate, nicotine, ondansetron **OR** ondansetron (ZOFRAN) IV, vecuronium  Assessment/ Plan:  68 y.o. African-American male with chronic alcoholism, current smoker, presented with small bowel obstruction on October 21.   Patient Active Problem List   Diagnosis Date Noted  . SBO (small bowel obstruction) ([place]) 08/10/2019    # Acute kidney injury -Oliguric.  Urine output recorded less than 100 cc last 24 hours - oliguric AKI, likely secondary to ATN - Clinical condition has worsened and overall prognosis appears poor -  palliative care working with family to clarify goals of care as  decision to dialyze (CRRT) will need to be made soon.   # Acute resp failure - intubated 10/26, Fio2 60%  # Hypernatremia -Improved from 152 down to 144 with IV D5W supplementation.   -Continue for now  # Hypokalemia - now in normal range  # Metabolic alkalosis - Due to NG tube suction - monitor closely - Improved  # SBO - Underwent exploratory laparotomy with extensive lysis of adhesions complicated by ATN/AKI. Bowel function has not returned yet. NG suction continued      LOS: [place] 13-Nov-20209:57 AM  [place], [place]  Note:  This note was prepared with Dragon dictation. Any transcription errors are unintentional

## 2019-08-27 DEATH — deceased

## 2021-01-23 IMAGING — DX DG ABD PORTABLE 1V
1 series · 1 of 1 positions shown · non-contrast
Comparison: None.

CLINICAL DATA: NG tube placement

EXAM:
PORTABLE ABDOMEN - 1 VIEW

[abdomen supine]
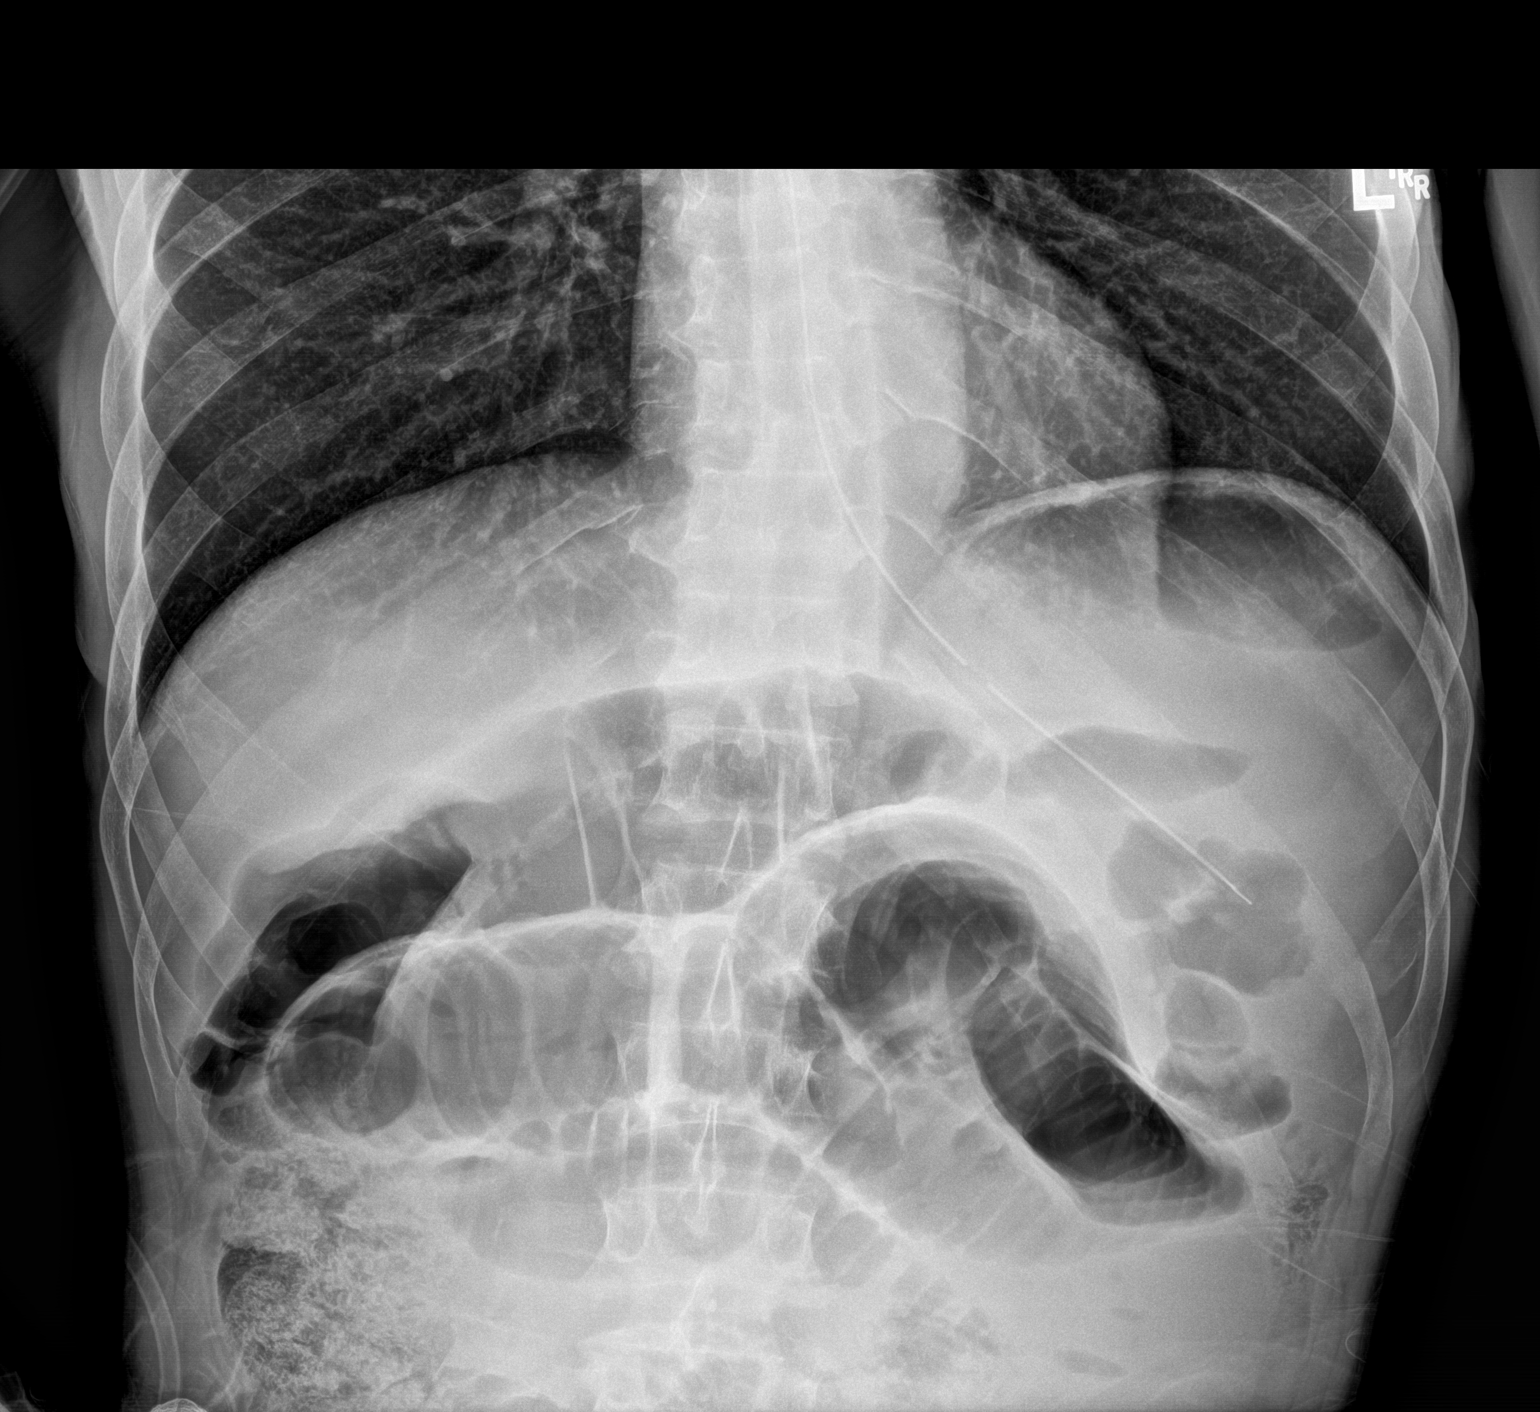

[1 of 1 positions shown; findings below may reference images not displayed]

FINDINGS: Tip of the NG tube is seen projecting over the mid body of the
stomach. Mildly dilated air-filled loops of small bowel in the mid
abdomen.
IMPRESSION: Tip of the NG tube seen projecting over the mid body of the stomach.

## 2021-01-24 IMAGING — US US RENAL
1 series · 14 of 25 positions shown · non-contrast
Comparison: CT abdomen and pelvis 08/16/2019.

CLINICAL DATA: Acute kidney injury.

EXAM:
RENAL / URINARY TRACT ULTRASOUND COMPLETE

[Series 1: us renal · 14 of 38 slices shown]
[im 1/38]
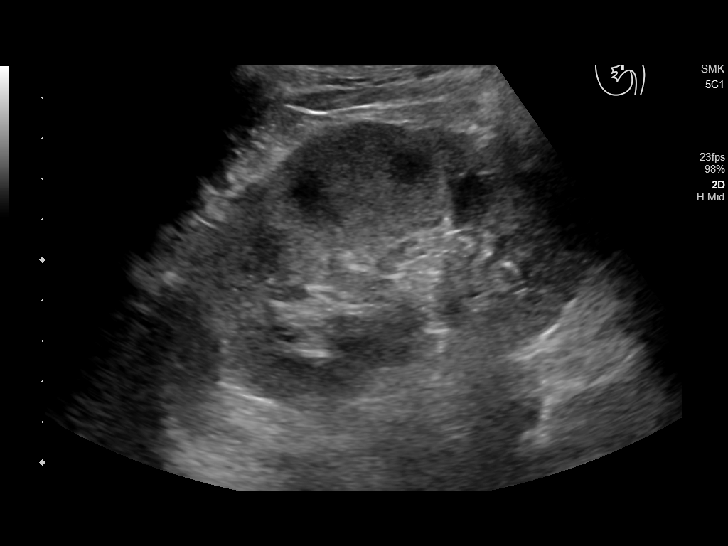
[im 4/38]
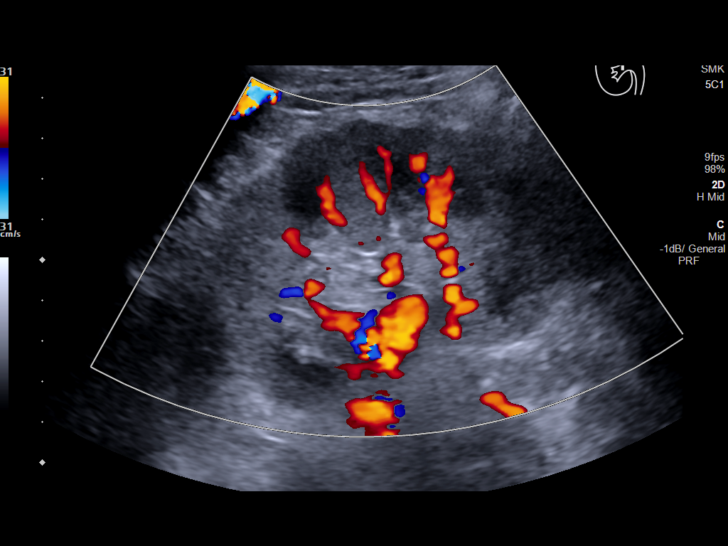
[im 7/38]
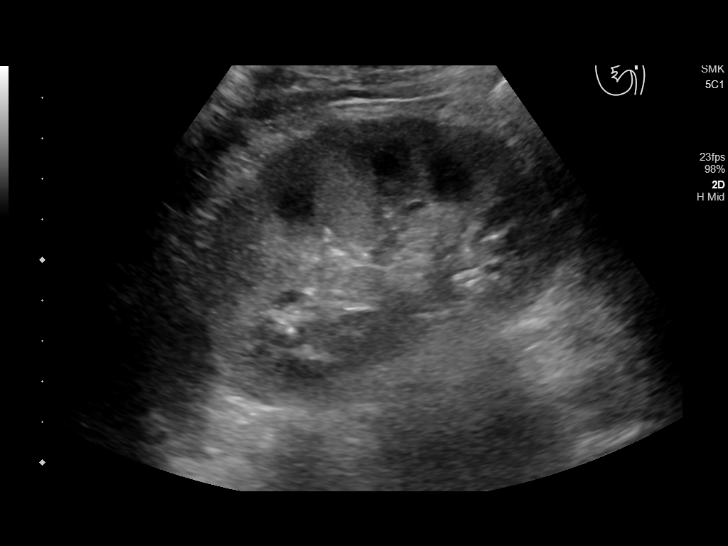
[im 10/38]
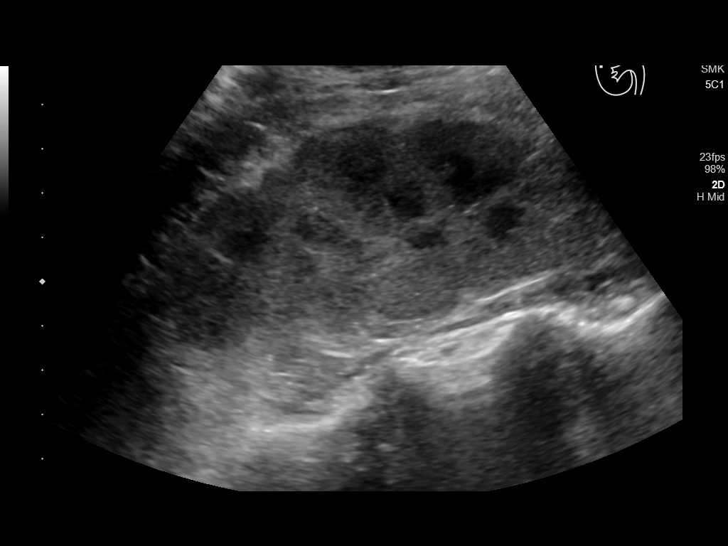
[im 13/38]
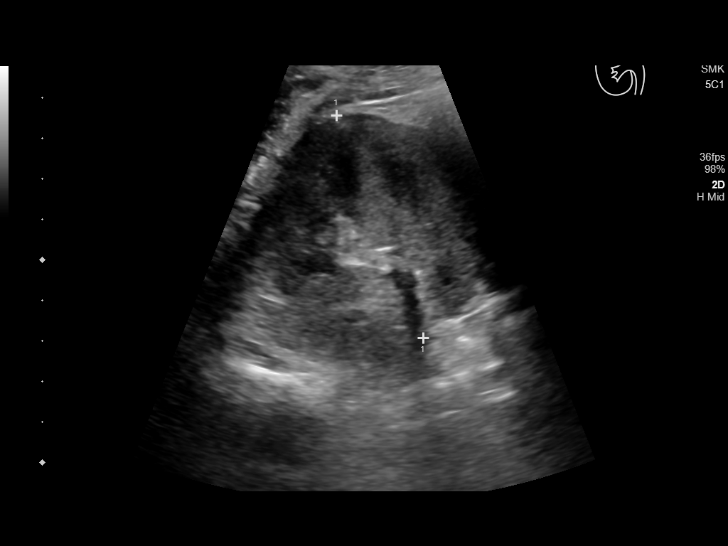
[im 14/38]
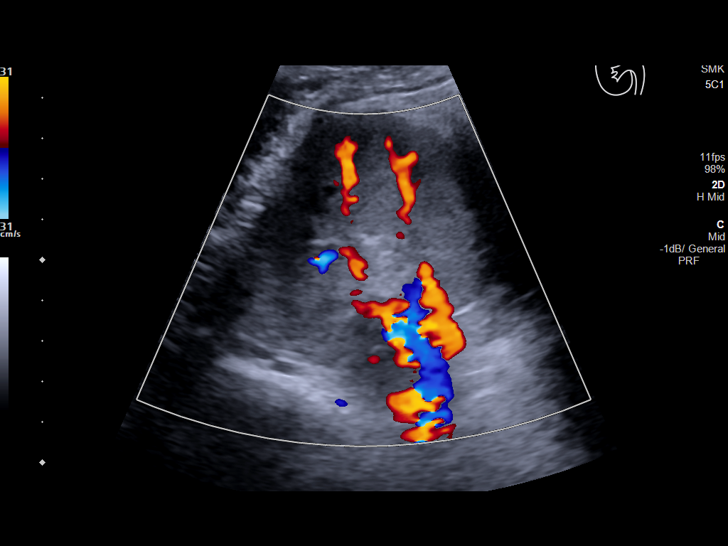
[im 17/38]
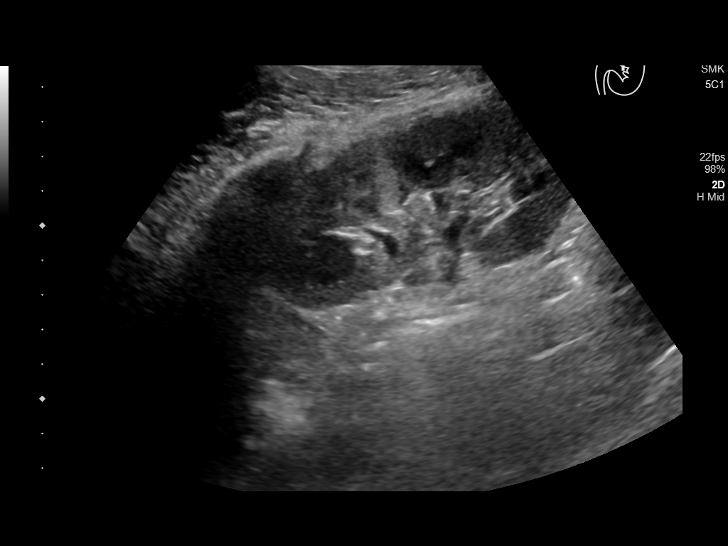
[im 21/38]
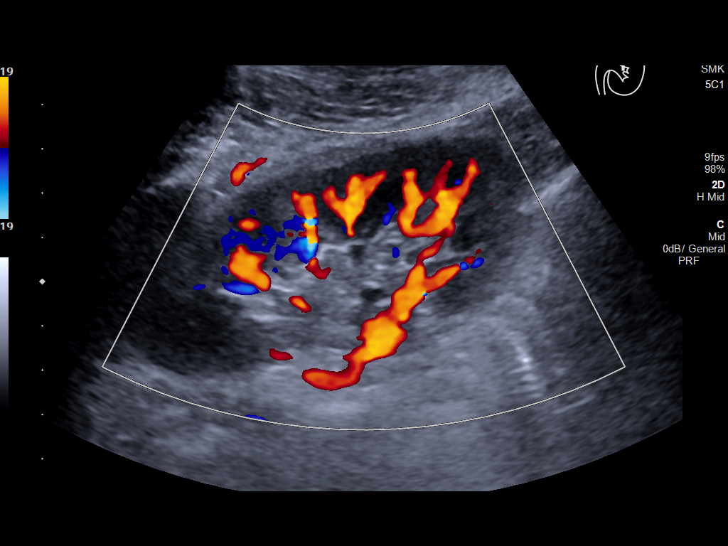
[im 24/38]
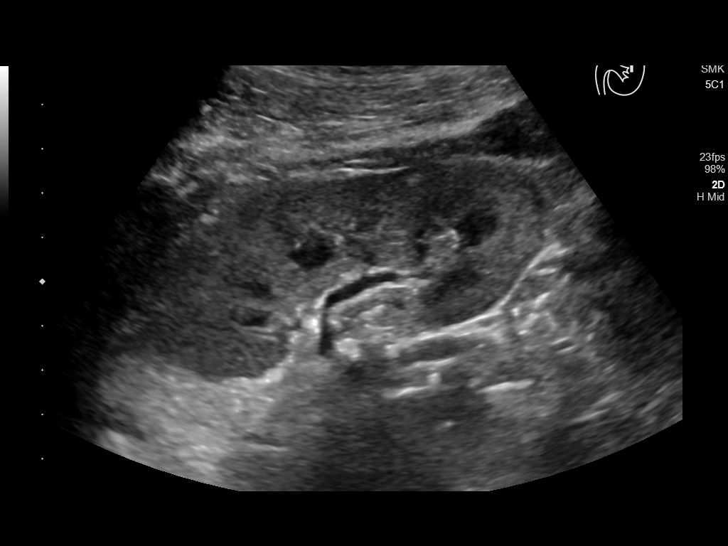
[im 25/38]
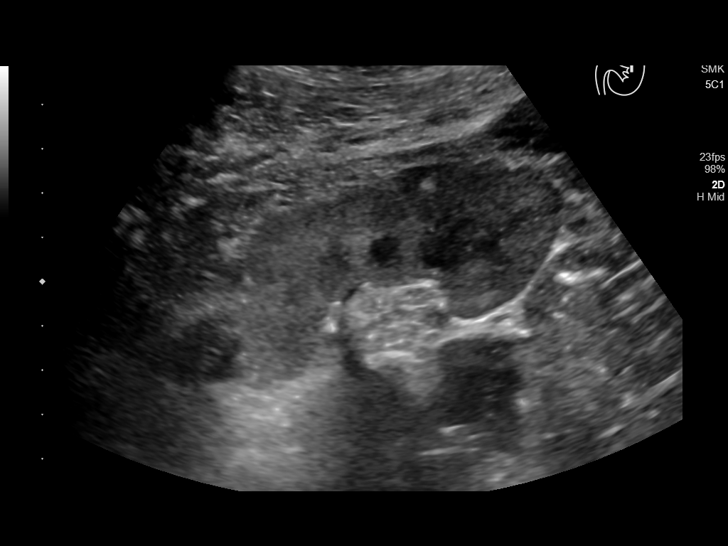
[im 28/38]
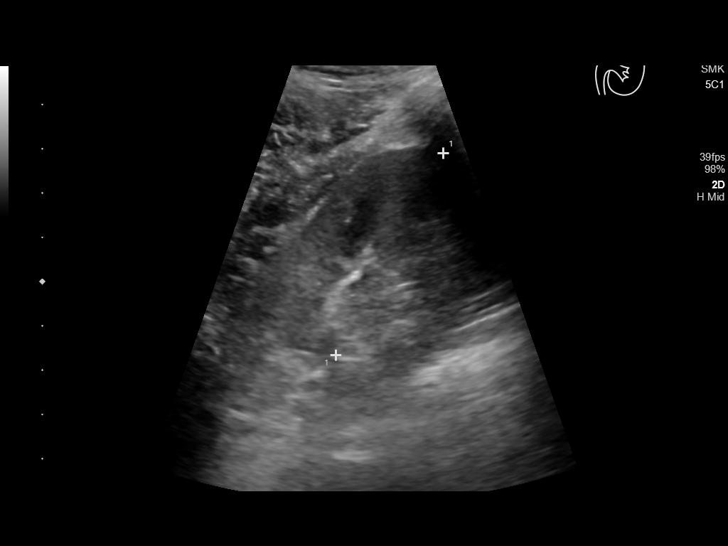
[im 31/38]
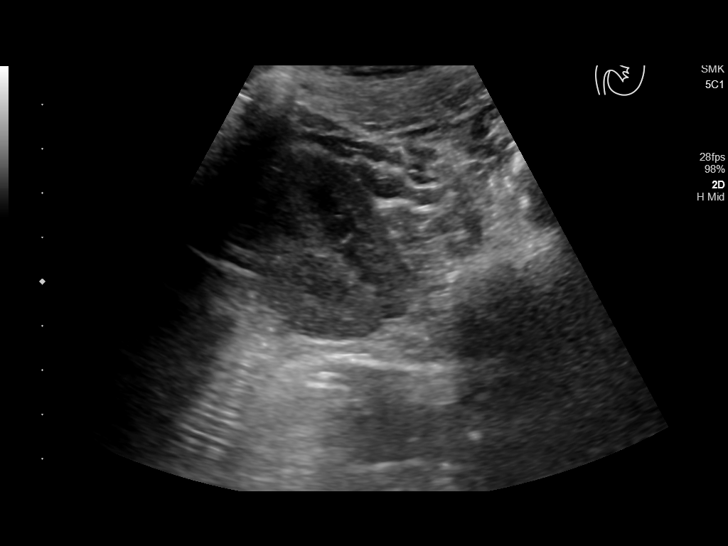
[im 34/38]
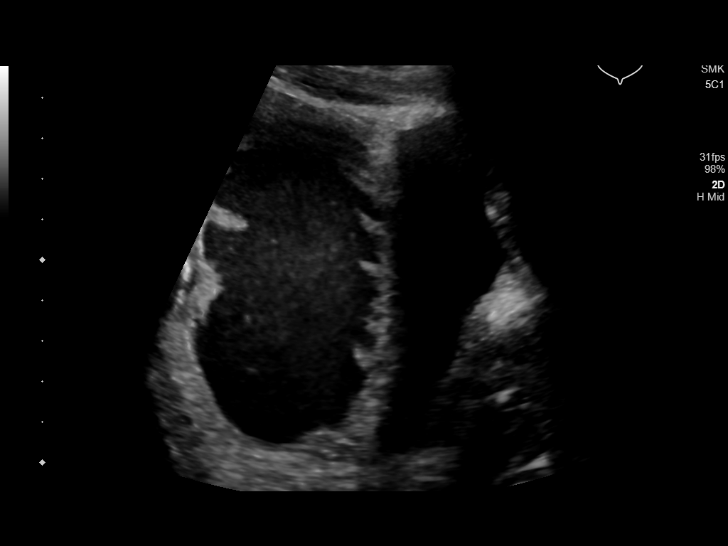
[im 38/38]
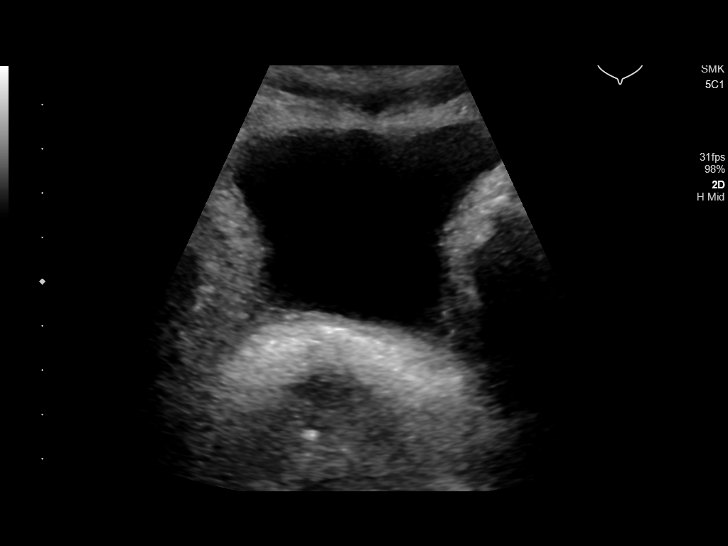

[14 of 25 positions shown; findings below may reference images not displayed]

FINDINGS: Right Kidney:

Renal measurements: 10.1 x 6.1 x 5.9 cm = volume: 188.4 mL .
Echogenicity within normal limits. No mass or hydronephrosis
visualized.

Left Kidney:

Renal measurements: 9.4 x 4.1 x 5.2 cm = volume: 104.9 mL.
Echogenicity within normal limits. No mass or hydronephrosis
visualized.

Bladder:

Appears normal for degree of bladder distention.

Other:

None.
IMPRESSION: Negative for hydronephrosis.  Negative exam.

## 2021-01-27 IMAGING — DX DG CHEST 1V PORT
1 series · 2 of 2 positions shown · non-contrast
Comparison: None.

CLINICAL DATA: Hypoxia

EXAM:
PORTABLE CHEST 1 VIEW

[Series 1: chest ap · 0.14mm/px · 2 of 2 slices shown]
[im 1/2]
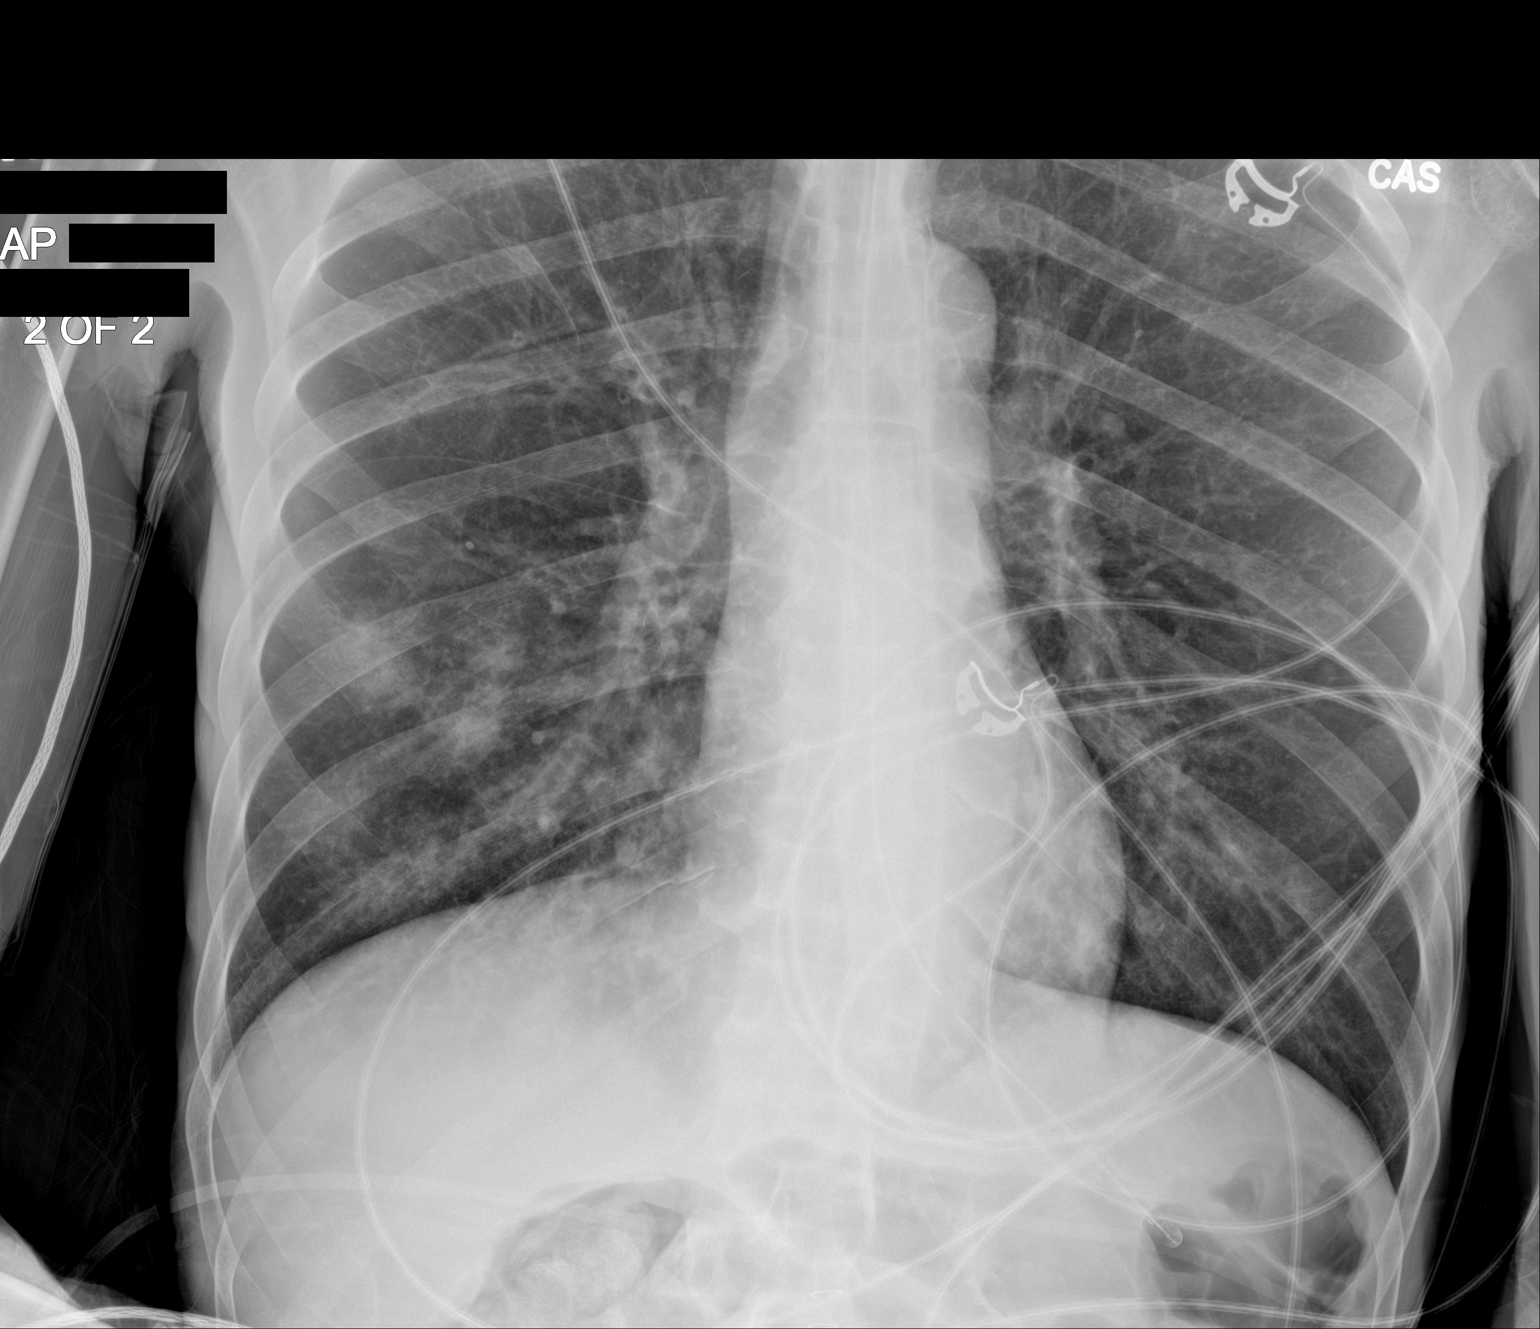
[im 2/2]
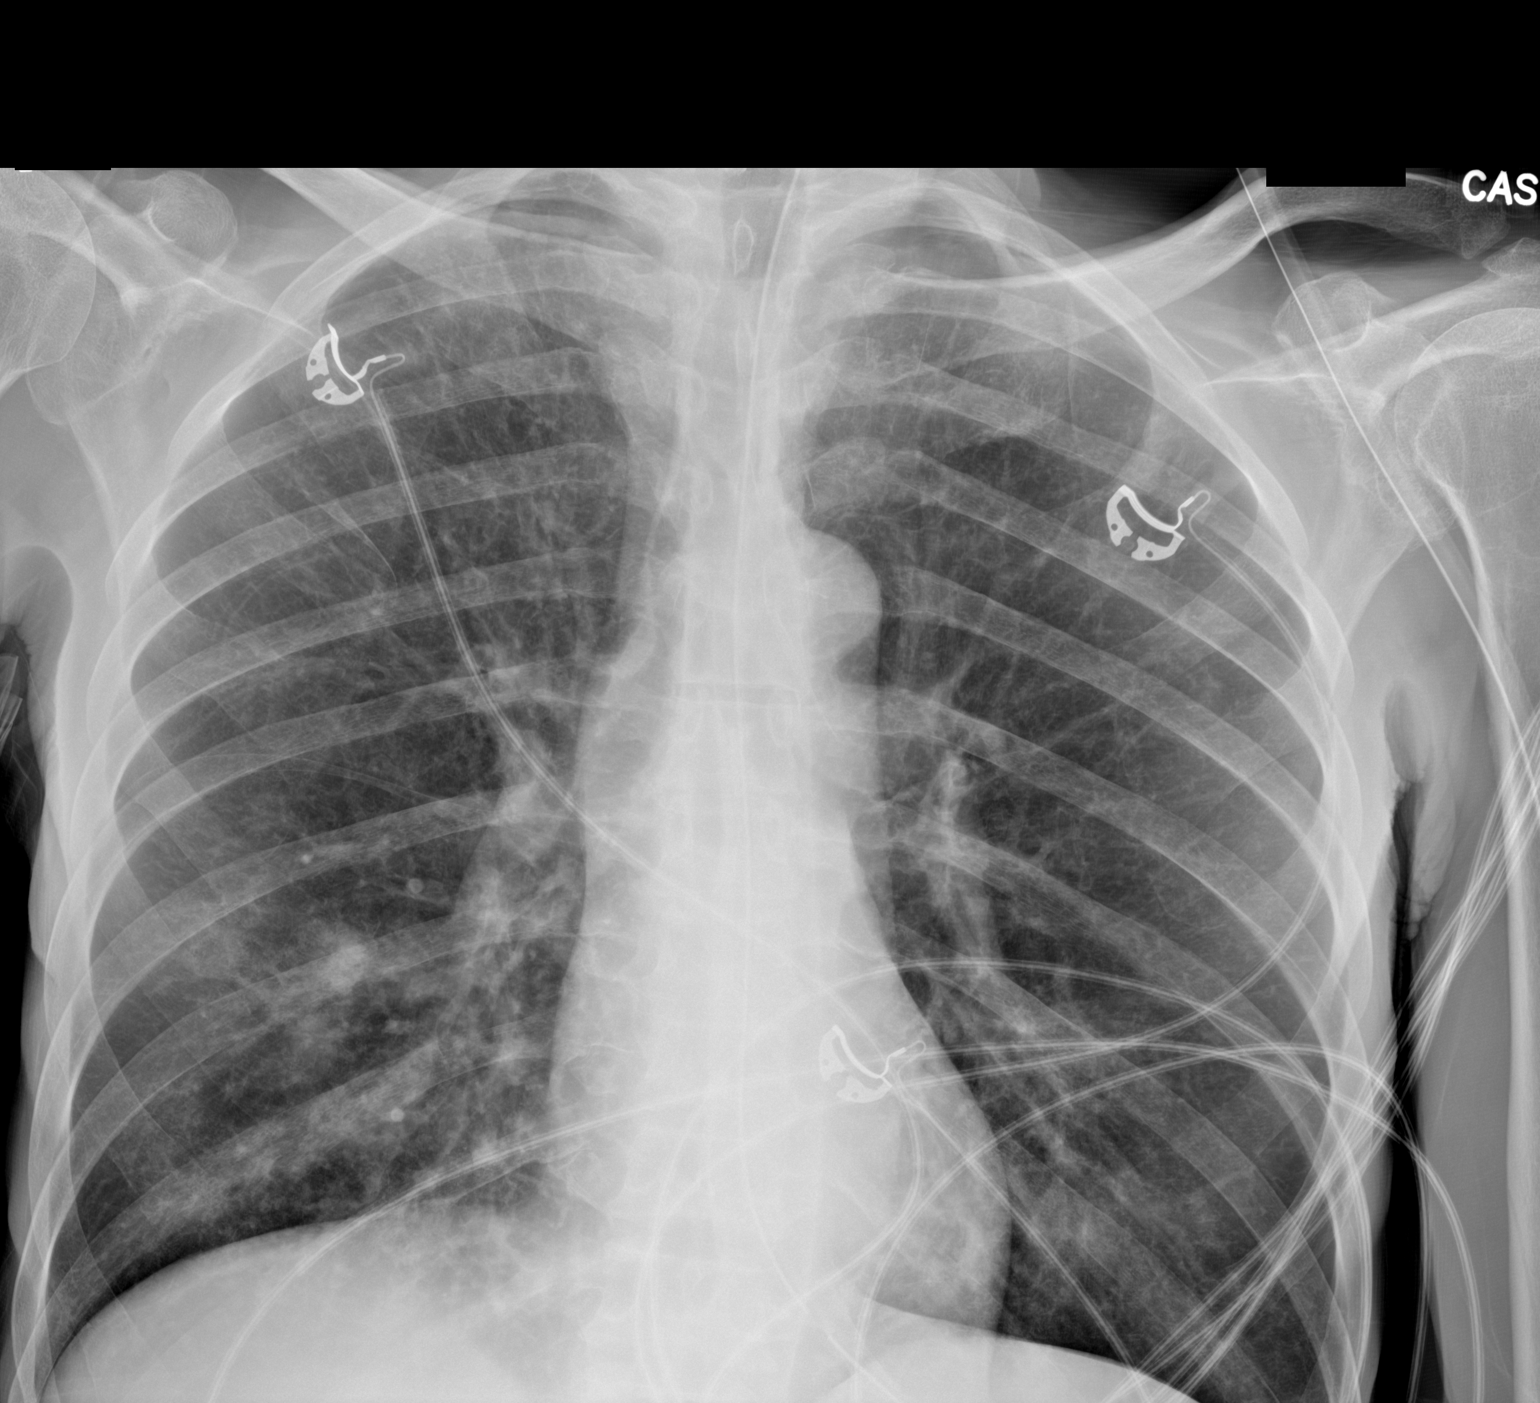

[2 of 2 positions shown; findings below may reference images not displayed]

FINDINGS: There is hazy right lower lobe airspace disease most concerning for
pneumonia. There is mild hazy left lower lobe airspace disease which
may reflect atelectasis versus pneumonia. There is no pleural
effusion or pneumothorax. The cardiomediastinal silhouette is
normal. There is no acute osseous abnormality.

There is a nasogastric tube with the tip projecting over the
stomach, but the proximal port is above the esophagogastric
junction. Recommend advancing nasogastric tube 10 cm.
IMPRESSION: 1. Hazy right lower lobe airspace disease most concerning for
pneumonia. Mild hazy left lower lobe airspace disease which may
reflect atelectasis versus pneumonia.
2. Nasogastric tube with the tip projecting over the stomach, but
the proximal port is above the esophagogastric junction. Recommend
advancing nasogastric tube 10 cm.
